# Patient Record
Sex: Male | Born: 1965 | Race: Black or African American | Hispanic: No | Marital: Married | Smoking: Never smoker
Health system: Southern US, Community
[De-identification: ages and names within clinical notes are randomized; demographics above are authoritative.]

## PROBLEM LIST (undated history)

## (undated) DIAGNOSIS — C801 Malignant (primary) neoplasm, unspecified: Secondary | ICD-10-CM

## (undated) DIAGNOSIS — M109 Gout, unspecified: Secondary | ICD-10-CM

## (undated) DIAGNOSIS — J189 Pneumonia, unspecified organism: Secondary | ICD-10-CM

## (undated) DIAGNOSIS — I2699 Other pulmonary embolism without acute cor pulmonale: Secondary | ICD-10-CM

## (undated) HISTORY — PX: ACHILLES TENDON SURGERY: SHX542

---

## 2003-11-22 ENCOUNTER — Ambulatory Visit (HOSPITAL_BASED_OUTPATIENT_CLINIC_OR_DEPARTMENT_OTHER): Admission: RE | Admit: 2003-11-22 | Discharge: 2003-11-22 | Payer: Self-pay | Admitting: Orthopaedic Surgery

## 2011-05-29 ENCOUNTER — Ambulatory Visit (INDEPENDENT_AMBULATORY_CARE_PROVIDER_SITE_OTHER): Payer: No Typology Code available for payment source | Admitting: Internal Medicine

## 2011-05-29 DIAGNOSIS — F988 Other specified behavioral and emotional disorders with onset usually occurring in childhood and adolescence: Secondary | ICD-10-CM

## 2011-05-29 DIAGNOSIS — I10 Essential (primary) hypertension: Secondary | ICD-10-CM

## 2011-11-27 ENCOUNTER — Encounter: Payer: Self-pay | Admitting: Internal Medicine

## 2011-11-27 ENCOUNTER — Ambulatory Visit (INDEPENDENT_AMBULATORY_CARE_PROVIDER_SITE_OTHER): Payer: No Typology Code available for payment source | Admitting: Internal Medicine

## 2011-11-27 VITALS — BP 140/94 | HR 64 | Temp 97.6°F | Resp 18 | Ht 69.5 in | Wt 238.0 lb

## 2011-11-27 DIAGNOSIS — F988 Other specified behavioral and emotional disorders with onset usually occurring in childhood and adolescence: Secondary | ICD-10-CM

## 2011-11-27 DIAGNOSIS — Z6834 Body mass index (BMI) 34.0-34.9, adult: Secondary | ICD-10-CM

## 2011-11-27 DIAGNOSIS — G4700 Insomnia, unspecified: Secondary | ICD-10-CM

## 2011-11-27 HISTORY — DX: Other specified behavioral and emotional disorders with onset usually occurring in childhood and adolescence: F98.8

## 2011-11-27 MED ORDER — AMPHETAMINE-DEXTROAMPHET ER 20 MG PO CP24: 20.0000 mg | ORAL_CAPSULE | ORAL | Status: DC

## 2011-11-27 MED ORDER — ZOLPIDEM TARTRATE 10 MG PO TABS: 10.0000 mg | ORAL_TABLET | Freq: Every evening | ORAL | Status: DC | PRN

## 2011-11-27 NOTE — Addendum Note (Signed)
Addended by: Tonye Pearson on: 11/27/2011 11:36 AM   Modules accepted: Level of Service

## 2011-11-27 NOTE — Progress Notes (Addendum)
  Subjective:    Patient ID: Ricky Bentley, male    DOB: 04-28-1966, 46 y.o.   MRN: [id number]  HPI Patient Active Problem List  Diagnosis  . BMI 34.0-34.9,adult  . Insomnia  . ADD (attention deficit disorder)    -  Elevated BP w/o dx of HTN  Doing well but sonata didn't work 3-4 nights a week wakes early or often  Adder working well Renita Papa busy-commerc/resid real estate  No weight loss-not active enough but now coaching boys baseball/staying home w/ them and cousins at times  Random home bp ok   Review of Systems-no changes     Objective:   Physical Exam Filed Vitals:   11/27/11 1104  BP: 140/94  Pulse: 64  Temp: 97.6 F (36.4 C)  Resp: 18  wt 238 PERRLA Ht reg rhythm Neuro intact        Assessment & Plan:   1. Insomnia   2. ADD (attention deficit disorder)   3. BMI 34.0-34.9,adult    Cont follow BP at home Wt loss-change snacks No chg ADD Rx ambien trial-occas use  Meds ordered this encounter  Medications  . DISCONTD: amphetamine-dextroamphetamine (ADDERALL XR) 20 MG 24 hr capsule    Sig: Take 20 mg by mouth every morning.  Marland Kitchen amphetamine-dextroamphetamine (ADDERALL XR) 20 MG 24 hr capsule    Sig: Take 1 capsule (20 mg total) by mouth every morning.    Dispense:  30 capsule    Refill:  0  . amphetamine-dextroamphetamine (ADDERALL XR, 20MG ,) 20 MG 24 hr capsule    Sig: Take 1 capsule (20 mg total) by mouth every morning.    Dispense:  30 capsule    Refill:  0  . amphetamine-dextroamphetamine (ADDERALL XR, 20MG ,) 20 MG 24 hr capsule    Sig: Take 1 capsule (20 mg total) by mouth every morning.    Dispense:  30 capsule    Refill:  0  . zolpidem (AMBIEN) 10 MG tablet    Sig: Take 1 tablet (10 mg total) by mouth at bedtime as needed for sleep.    Dispense:  15 tablet    Refill:  2   3 months -6 months  Can call if stable

## 2012-03-04 ENCOUNTER — Ambulatory Visit (INDEPENDENT_AMBULATORY_CARE_PROVIDER_SITE_OTHER): Payer: No Typology Code available for payment source | Admitting: Internal Medicine

## 2012-03-04 ENCOUNTER — Encounter: Payer: Self-pay | Admitting: Internal Medicine

## 2012-03-04 VITALS — BP 130/82 | HR 60 | Temp 97.6°F | Resp 16 | Ht 69.0 in | Wt 234.0 lb

## 2012-03-04 DIAGNOSIS — G4700 Insomnia, unspecified: Secondary | ICD-10-CM

## 2012-03-04 DIAGNOSIS — F988 Other specified behavioral and emotional disorders with onset usually occurring in childhood and adolescence: Secondary | ICD-10-CM

## 2012-03-04 DIAGNOSIS — Z6834 Body mass index (BMI) 34.0-34.9, adult: Secondary | ICD-10-CM

## 2012-03-04 MED ORDER — AMPHETAMINE-DEXTROAMPHET ER 20 MG PO CP24: 20.0000 mg | ORAL_CAPSULE | ORAL | Status: DC

## 2012-03-04 MED ORDER — ZOLPIDEM TARTRATE ER 12.5 MG PO TBCR: 12.5000 mg | EXTENDED_RELEASE_TABLET | Freq: Every evening | ORAL | Status: DC | PRN

## 2012-03-04 NOTE — Progress Notes (Signed)
Patient Active Problem List  Diagnosis  . BMI 34.0-34.9,adult  . Insomnia  . ADD (attention deficit disorder)  Doing very well Meds still helping greatly at work Still has trouble waking during the night even with Ambien 10 mg which he takes occasionally after 3 nights in a row of poor sleep He would like to try long-acting form  Has done better with exercise and lost 6 pounds Now running and will do a 5K this weekend Goal 210  Boys 9&8 playing ball  Continues in good health in general/denies flu vaccine  Exam Filed Vitals:   03/04/12 1039  BP: 130/82  Pulse: 60  Temp: 97.6 F (36.4 C)  Resp: 16   Pupils ERRLA Heart regular Neurological intact  1. ADD (attention deficit disorder)   2. BMI 34.0-34.9,adult   3. Insomnia    Meds ordered this encounter  Medications  . zolpidem (AMBIEN CR) 12.5 MG CR tablet    Sig: Take 1 tablet (12.5 mg total) by mouth at bedtime as needed for sleep.    Dispense:  15 tablet    Refill:  2  . amphetamine-dextroamphetamine (ADDERALL XR) 20 MG 24 hr capsule    Sig: Take 1 capsule (20 mg total) by mouth every morning.    Dispense:  30 capsule    Refill:  0  . amphetamine-dextroamphetamine (ADDERALL XR) 20 MG 24 hr capsule    Sig: Take 1 capsule (20 mg total) by mouth every morning.    Dispense:  30 capsule    Refill:  0  . amphetamine-dextroamphetamine (ADDERALL XR) 20 MG 24 hr capsule    Sig: Take 1 capsule (20 mg total) by mouth every morning.    Dispense:  30 capsule    Refill:  0   Followup 3-6 months/or call CPE with immunization review and lipid profile and prostate antigen at age 46

## 2012-09-02 ENCOUNTER — Ambulatory Visit: Payer: No Typology Code available for payment source | Admitting: Internal Medicine

## 2013-01-27 ENCOUNTER — Other Ambulatory Visit: Payer: Self-pay | Admitting: Internal Medicine

## 2013-01-27 ENCOUNTER — Ambulatory Visit (INDEPENDENT_AMBULATORY_CARE_PROVIDER_SITE_OTHER): Payer: BC Managed Care – PPO | Admitting: Internal Medicine

## 2013-01-27 ENCOUNTER — Encounter: Payer: Self-pay | Admitting: Internal Medicine

## 2013-01-27 VITALS — BP 130/82 | HR 70 | Temp 98.0°F | Resp 16 | Ht 69.5 in | Wt 246.0 lb

## 2013-01-27 DIAGNOSIS — E291 Testicular hypofunction: Secondary | ICD-10-CM

## 2013-01-27 DIAGNOSIS — N529 Male erectile dysfunction, unspecified: Secondary | ICD-10-CM

## 2013-01-27 DIAGNOSIS — G4700 Insomnia, unspecified: Secondary | ICD-10-CM

## 2013-01-27 DIAGNOSIS — E663 Overweight: Secondary | ICD-10-CM

## 2013-01-27 DIAGNOSIS — F988 Other specified behavioral and emotional disorders with onset usually occurring in childhood and adolescence: Secondary | ICD-10-CM

## 2013-01-27 MED ORDER — AMPHETAMINE-DEXTROAMPHET ER 20 MG PO CP24: 20.0000 mg | ORAL_CAPSULE | ORAL | Status: DC

## 2013-01-27 MED ORDER — ZOLPIDEM TARTRATE ER 12.5 MG PO TBCR: 12.5000 mg | EXTENDED_RELEASE_TABLET | Freq: Every evening | ORAL | Status: DC | PRN

## 2013-01-27 NOTE — Progress Notes (Signed)
  Subjective:    Patient ID: Ricky Bentley, male    DOB: April 20, 1966, 47 y.o.   MRN: [id number]  HPI Followup for attention deficit disorder/doing well/no side effects  Insomnia much better controlled with Ambien CR when necessary  Now noticing erectile dysfunction. Has had difficulty sustaining erections. No problems with interest. His wife is complaining. This has been present for 3-6 months. He has gained 10 pounds in that time.   Review of Systems No fever chills or night sweats No extra fatigue or weight loss No chest pain or palpitations No shortness of breath No peripheral edema No peripheral paresthesias    Objective:   Physical Exam BP 130/82  Pulse 70  Temp(Src) 98 F (36.7 C) (Oral)  Resp 16  Ht 5' 9.5" (1.765 m)  Wt 246 lb (111.585 kg)  BMI 35.82 kg/m2  SpO2 98% HEENT clear No thyromegaly Heart regular without murmur Lungs clear No peripheral edema Good peripheral pulses No peripheral paresthesias Mood good affect appropriate       Assessment & Plan:  ADD (attention deficit disorder) -  Plan: amphetamine-dextroamphetamine (ADDERALL XR) 20 MG 24 hr capsule,   ED (erectile dysfunction) - Plan: CBC with Differential, TSH, Testosterone, free, total, PSA  Overweight - Plan: Comprehensive metabolic panel, Lipid panel,  Insomnia- zolpidem (AMBIEN CR) 12.5 MG CR tablet

## 2013-01-28 LAB — COMPREHENSIVE METABOLIC PANEL
Albumin: 4.9 g/dL (ref 3.5–5.2)
BUN: 20 mg/dL (ref 6–23)
Calcium: 9.4 mg/dL (ref 8.4–10.5)
Chloride: 104 mEq/L (ref 96–112)
Creat: 1.45 mg/dL — ABNORMAL HIGH (ref 0.50–1.35)
Glucose, Bld: 83 mg/dL (ref 70–99)
Potassium: 4.4 mEq/L (ref 3.5–5.3)

## 2013-01-28 LAB — CBC WITH DIFFERENTIAL/PLATELET
Basophils Absolute: 0 10*3/uL (ref 0.0–0.1)
Basophils Relative: 1 % (ref 0–1)
Eosinophils Relative: 2 % (ref 0–5)
HCT: 45.1 % (ref 39.0–52.0)
Hemoglobin: 15.8 g/dL (ref 13.0–17.0)
Lymphocytes Relative: 33 % (ref 12–46)
MCHC: 35 g/dL (ref 30.0–36.0)
MCV: 86.2 fL (ref 78.0–100.0)
Monocytes Absolute: 0.4 10*3/uL (ref 0.1–1.0)
Monocytes Relative: 8 % (ref 3–12)
Neutro Abs: 2.8 10*3/uL (ref 1.7–7.7)
RDW: 13.8 % (ref 11.5–15.5)

## 2013-01-28 LAB — TESTOSTERONE, FREE, TOTAL, SHBG
Testosterone, Free: 45.1 pg/mL — ABNORMAL LOW (ref 47.0–244.0)
Testosterone-% Free: 2.6 % (ref 1.6–2.9)

## 2013-01-28 LAB — LIPID PANEL
Cholesterol: 173 mg/dL (ref 0–200)
HDL: 45 mg/dL (ref >39)
Total CHOL/HDL Ratio: 3.8 Ratio
Triglycerides: 150 mg/dL — ABNORMAL HIGH (ref <150)

## 2013-01-28 LAB — PSA: PSA: 1.03 ng/mL (ref <=4.00)

## 2013-01-30 ENCOUNTER — Encounter: Payer: Self-pay | Admitting: Internal Medicine

## 2013-02-03 NOTE — Addendum Note (Signed)
Addended by: Tonye Pearson on: 02/03/2013 06:40 PM   Modules accepted: Orders

## 2013-02-03 NOTE — Progress Notes (Signed)
Results for orders placed in visit on 01/27/13  CBC WITH DIFFERENTIAL      Result Value Range   WBC 5.0  4.0 - 10.5 K/uL   RBC 5.23  4.22 - 5.81 MIL/uL   Hemoglobin 15.8  13.0 - 17.0 g/dL   HCT 19.1  47.8 - 29.5 %   MCV 86.2  78.0 - 100.0 fL   MCH 30.2  26.0 - 34.0 pg   MCHC 35.0  30.0 - 36.0 g/dL   RDW 62.1  30.8 - 65.7 %   Platelets 263  150 - 400 K/uL   Neutrophils Relative % 56  43 - 77 %   Neutro Abs 2.8  1.7 - 7.7 K/uL   Lymphocytes Relative 33  12 - 46 %   Lymphs Abs 1.6  0.7 - 4.0 K/uL   Monocytes Relative 8  3 - 12 %   Monocytes Absolute 0.4  0.1 - 1.0 K/uL   Eosinophils Relative 2  0 - 5 %   Eosinophils Absolute 0.1  0.0 - 0.7 K/uL   Basophils Relative 1  0 - 1 %   Basophils Absolute 0.0  0.0 - 0.1 K/uL   Smear Review Criteria for review not met    COMPREHENSIVE METABOLIC PANEL      Result Value Range   Sodium 139  135 - 145 mEq/L   Potassium 4.4  3.5 - 5.3 mEq/L   Chloride 104  96 - 112 mEq/L   CO2 27  19 - 32 mEq/L   Glucose, Bld 83  70 - 99 mg/dL   BUN 20  6 - 23 mg/dL   Creat 8.46 (*) 9.62 - 1.35 mg/dL   Total Bilirubin 1.3 (*) 0.3 - 1.2 mg/dL   Alkaline Phosphatase 48  39 - 117 U/L   AST 25  0 - 37 U/L   ALT 27  0 - 53 U/L   Total Protein 7.0  6.0 - 8.3 g/dL   Albumin 4.9  3.5 - 5.2 g/dL   Calcium 9.4  8.4 - 95.2 mg/dL  LIPID PANEL      Result Value Range   Cholesterol 173  0 - 200 mg/dL   Triglycerides 841 (*) <150 mg/dL   HDL 45  >32 mg/dL   Total CHOL/HDL Ratio 3.8     VLDL 30  0 - 40 mg/dL   LDL Cholesterol 98  0 - 99 mg/dL  TSH      Result Value Range   TSH 1.779  0.350 - 4.500 uIU/mL  TESTOSTERONE, FREE, TOTAL      Result Value Range   Testosterone 176 (*) 300 - 890 ng/dL   Sex Hormone Binding 18  13 - 71 nmol/L   Testosterone, Free 45.1 (*) 47.0 - 244.0 pg/mL   Testosterone-% Freee. 2.6  1.6 - 2.9 %  PSA      Result Value Range   PSA 1.03  <=4.00 ng/mL   Fsh/LH wnl so secondary hypogonadism Refer to endo to complete w/u and plan rx

## 2013-02-16 ENCOUNTER — Encounter: Payer: Self-pay | Admitting: Endocrinology

## 2013-02-16 ENCOUNTER — Ambulatory Visit (INDEPENDENT_AMBULATORY_CARE_PROVIDER_SITE_OTHER): Payer: BC Managed Care – PPO | Admitting: Endocrinology

## 2013-02-16 VITALS — BP 130/90 | HR 59 | Temp 98.3°F | Resp 12 | Ht 70.0 in | Wt 249.9 lb

## 2013-02-16 DIAGNOSIS — E291 Testicular hypofunction: Secondary | ICD-10-CM

## 2013-02-16 DIAGNOSIS — N529 Male erectile dysfunction, unspecified: Secondary | ICD-10-CM

## 2013-02-16 DIAGNOSIS — R5381 Other malaise: Secondary | ICD-10-CM

## 2013-02-16 DIAGNOSIS — N289 Disorder of kidney and ureter, unspecified: Secondary | ICD-10-CM

## 2013-02-16 NOTE — Progress Notes (Signed)
Patient ID: Ricky Bentley, male   DOB: [birth year], 47 y.o.   MRN: [id number]  Complaint: Decreased libido  History of Present Illness  For about a year he has noticed decreased sexual desire and this has been progressively getting worse He does not think he has any unusual stress or other factors in his relationship to affect libido He had mentioned to his primary care physician that he had decreased erectile function but mainly has difficulty with inadequate firmness of erection. He still has normal nocturnal erections He may also have had less motivation and stamina for activities over the last year. He still tries to exercise with walking or running every day Overall he thinks he has gained weight over the last year from inconsistent diet and recently has been trying to modify this to help his weight  HypogonadismWas diagnosed in 8/13 when he was being evaluated for above symptoms He denies the following: Height loss, low trauma fracture, Hot flushes, sweating spells, breast enlargement, long term steroid use, history of testicular injury or mumps in childhood.   Wt Readings from Last 3 Encounters:  02/16/13 249 lb 14.4 oz (113.354 kg)  01/27/13 246 lb (111.585 kg)  03/04/12 234 lb (106.142 kg)    The lab findings have includedtestosterone level of 176 with free testosterone 45, prolactin level was not checked , LH level is 2.7     Lab Results  Component Value Date   TESTOSTERONE 176* 01/27/2013   Office Visit on 01/27/2013  Component Date Value Range Status  . WBC 01/27/2013 5.0  4.0 - 10.5 K/uL Final  . RBC 01/27/2013 5.23  4.22 - 5.81 MIL/uL Final  . Hemoglobin 01/27/2013 15.8  13.0 - 17.0 g/dL Final  . HCT 08/65/7846 45.1  39.0 - 52.0 % Final  . MCV 01/27/2013 86.2  78.0 - 100.0 fL Final  . MCH 01/27/2013 30.2  26.0 - 34.0 pg Final  . MCHC 01/27/2013 35.0  30.0 - 36.0 g/dL Final  . RDW 96/29/5284 13.8  11.5 - 15.5 % Final  . Platelets 01/27/2013 263  150 - 400  K/uL Final  . Neutrophils Relative % 01/27/2013 56  43 - 77 % Final  . Neutro Abs 01/27/2013 2.8  1.7 - 7.7 K/uL Final  . Lymphocytes Relative 01/27/2013 33  12 - 46 % Final  . Lymphs Abs 01/27/2013 1.6  0.7 - 4.0 K/uL Final  . Monocytes Relative 01/27/2013 8  3 - 12 % Final  . Monocytes Absolute 01/27/2013 0.4  0.1 - 1.0 K/uL Final  . Eosinophils Relative 01/27/2013 2  0 - 5 % Final  . Eosinophils Absolute 01/27/2013 0.1  0.0 - 0.7 K/uL Final  . Basophils Relative 01/27/2013 1  0 - 1 % Final  . Basophils Absolute 01/27/2013 0.0  0.0 - 0.1 K/uL Final  . Smear Review 01/27/2013 Criteria for review not met   Final  . Sodium 01/27/2013 139  135 - 145 mEq/L Final  . Potassium 01/27/2013 4.4  3.5 - 5.3 mEq/L Final  . Chloride 01/27/2013 104  96 - 112 mEq/L Final  . CO2 01/27/2013 27  19 - 32 mEq/L Final  . Glucose, Bld 01/27/2013 83  70 - 99 mg/dL Final  . BUN 13/24/4010 20  6 - 23 mg/dL Final  . Creat 27/25/3664 1.45* 0.50 - 1.35 mg/dL Final  . Total Bilirubin 01/27/2013 1.3* 0.3 - 1.2 mg/dL Final  . Alkaline Phosphatase 01/27/2013 48  39 - 117 U/L Final  . AST 01/27/2013  25  0 - 37 U/L Final  . ALT 01/27/2013 27  0 - 53 U/L Final  . Total Protein 01/27/2013 7.0  6.0 - 8.3 g/dL Final  . Albumin 16/03/9603 4.9  3.5 - 5.2 g/dL Final  . Calcium 54/02/8118 9.4  8.4 - 10.5 mg/dL Final  . Cholesterol 14/78/2956 173  0 - 200 mg/dL Final   Comment: ATP III Classification:                                < 200        mg/dL        Desirable                               200 - 239     mg/dL        Borderline High                               >= 240        mg/dL        High                             . Triglycerides 01/27/2013 150* <150 mg/dL Final  . HDL 21/30/8657 45  >39 mg/dL Final  . Total CHOL/HDL Ratio 01/27/2013 3.8   Final  . VLDL 01/27/2013 30  0 - 40 mg/dL Final  . LDL Cholesterol 01/27/2013 98  0 - 99 mg/dL Final   Comment:                            Total Cholesterol/HDL Ratio:CHD  Risk                                                 Coronary Heart Disease Risk Table                                                                 Men       Women                                   1/2 Average Risk              3.4        3.3                                       Average Risk              5.0        4.4  2X Average Risk              9.6        7.1                                    3X Average Risk             23.4       11.0                          Use the calculated Patient Ratio above and the CHD Risk table                           to determine the patient's CHD Risk.                          ATP III Classification (LDL):                                < 100        mg/dL         Optimal                               100 - 129     mg/dL         Near or Above Optimal                               130 - 159     mg/dL         Borderline High                               160 - 189     mg/dL         High                                > 190        mg/dL         Very High                             . TSH 01/27/2013 1.779  0.350 - 4.500 uIU/mL Final  . Testosterone 01/27/2013 176* 300 - 890 ng/dL Final   Comment:           Tanner Stage       Male              Male                                        I              < 30 ng/dL        < 10 ng/dL  II             < 150 ng/dL       < 30 ng/dL                                        III            100-320 ng/dL     < 35 ng/dL                                        IV             200-970 ng/dL     16-10 ng/dL                                        V/Adult        300-890 ng/dL     96-04 ng/dL                             . Sex Hormone Binding 01/27/2013 18  13 - 71 nmol/L Final  . Testosterone, Free 01/27/2013 45.1* 47.0 - 244.0 pg/mL Final   Comment:                            The concentration of free testosterone is derived from a mathematical                           expression based on constants for the binding of testosterone to sex                          hormone-binding globulin and albumin.  . Testosterone-% Freee. 01/27/2013 2.6  1.6 - 2.9 % Final  . PSA 01/27/2013 1.03  <=4.00 ng/mL Final   Comment: Test Methodology: ECLIA PSA (Electrochemiluminescence Immunoassay)                                                     For PSA values from 2.5-4.0, particularly in younger men <60 years                          old, the AUA and NCCN suggest testing for % Free PSA (3515) and                          evaluation of the rate of increase in PSA (PSA velocity).  Orders Only on 01/27/2013  Component Date Value Range Status  . FSH 01/27/2013 2.2  1.4 - 18.1 mIU/mL Final   Comment: Reference Ranges:                                   Male:  1.4 -  18.1 mIU/mL                                   Male:   Follicular Phase    2.5 -  10.2 mIU/mL                                             MidCycle Peak       3.4 -  33.4 mIU/mL                                             Luteal Phase        1.5 -   9.1 mIU/mL                                             Post Menopausal    23.0 - 116.3 mIU/mL                                             Pregnant                <   0.3 mIU/mL  . LH 01/27/2013 2.7  1.5 - 9.3 mIU/mL Final   Comment: Reference Ranges:                                   Male:     20 - 70 Years           1.5 -  9.3 mIU/mL                                                > 70 Years           3.1 - 34.6 mIU/mL                                   Male:   Follicular Phase        1.9 - 12.5 mIU/mL                                             Midcycle                8.7 - 76.3 mIU/mL                                             Luteal Phase            0.5 - 16.9 mIU/mL  Post Menopausal        15.9 - 54.0 mIU/mL                                             Pregnant                    <   1.5 mIU/mL                                             Contraceptives          0.7 -  5.6 mIU/mL                                   Children:                             <  6.0 mIU/mL                                  Medication List       This list is accurate as of: 02/16/13 10:48 AM.  Always use your most recent med list.               amphetamine-dextroamphetamine 20 MG 24 hr capsule  Commonly known as:  ADDERALL XR  Take 1 capsule (20 mg total) by mouth every morning.     zolpidem 12.5 MG CR tablet  Commonly known as:  AMBIEN CR  Take 1 tablet (12.5 mg total) by mouth at bedtime as needed for sleep.        Allergies: No Known Allergies  No past medical history on file.  No past surgical history on file.  No family history on file.  Social History:  reports that he has never smoked. He does not have any smokeless tobacco history on file. His alcohol and drug histories are not on file.  REVIEW of systems:  No history of unusual headaches or visual changes No history of diabetes No history of hypertension Has history of ADD He takes zolpidem for insomnia as needed   General Examination:   BP 130/90  Pulse 59  Temp(Src) 98.3 F (36.8 C)  Resp 12  Ht 5\' 10"  (1.778 m)  Wt 249 lb 14.4 oz (113.354 kg)  BMI 35.86 kg/m2  SpO2 96%   GENERAL APPEARANCE mild generalized obesity.  SKIN:normal, no rash or pigmentation.  HEENT:Oral mucosa normal.   EYES:normal external appearance of eyes, Fundi show normal discs and vessels.   NECK:no lymphadenopathy, no thyromegaly. Chest: No gynecomastia  LUNGS:clear to auscultation bilaterally, no wheezes, rhonchi, rales.  HEART:normal S1 And S2, no S3, S4, murmur or click.  ABDOMEN:no hepatosplenomegaly, no masses palpated, soft and not tender.  MALE GENITOURINARY:Testicles normal, about 3.5-4 cm.  MUSCULOSKELETALNo enlargement or deformity of joints.   EXTREMITIES:no clubbing, no edema.  NEUROLOGIC EXAM: Biceps reflexes normal (2+) bilaterally.   Assessment/ Plan:  ? 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   He may have low testosterone level based on metabolic syndrome/insulin resistance because of his obesity, recent weight gain and family history of diabetes  Currently his main symptom appears to be decreased libido with only mild erectile dysfunction He thinks he has had some decreased stamina and energy level also Not clear why he has erectile dysfunction at his age with no other comorbid conditions  However his labs were drawn in the afternoon and his free testosterone level is only minimally reduced Would like to check his labs in the morning and repeat a free testosterone level to confirm hypogonadism Also will check prolactin to rule out pituitary microadenoma as potential cause Will check free T4 level to rule out secondary hypothyroidism Also will check fasting glucose to rule out prediabetes  Discussed potential treatment options including observation only with weight loss efforts, testosterone supplementation and also the benefits and possible long-term effects of testosterone supplements  He is willing to try weight loss efforts first especially if testosterone level is only borderline  RENAL insufficiency: His creatinine is 1.45 which is unusual and the cause is not evident. Will repeat creatinine level Blood pressure is high normal today, possibly related to anxiety

## 2013-02-16 NOTE — Patient Instructions (Signed)
Fasting labs

## 2013-02-19 DIAGNOSIS — R7989 Other specified abnormal findings of blood chemistry: Secondary | ICD-10-CM | POA: Insufficient documentation

## 2013-02-19 DIAGNOSIS — N529 Male erectile dysfunction, unspecified: Secondary | ICD-10-CM | POA: Insufficient documentation

## 2013-03-01 ENCOUNTER — Other Ambulatory Visit (INDEPENDENT_AMBULATORY_CARE_PROVIDER_SITE_OTHER): Payer: BC Managed Care – PPO

## 2013-03-01 DIAGNOSIS — E291 Testicular hypofunction: Secondary | ICD-10-CM

## 2013-03-01 DIAGNOSIS — R5381 Other malaise: Secondary | ICD-10-CM

## 2013-03-01 DIAGNOSIS — N289 Disorder of kidney and ureter, unspecified: Secondary | ICD-10-CM

## 2013-03-01 LAB — BASIC METABOLIC PANEL
CO2: 28 mEq/L (ref 19–32)
Calcium: 8.6 mg/dL (ref 8.4–10.5)
Creatinine, Ser: 1.5 mg/dL (ref 0.4–1.5)
GFR: 67.19 mL/min (ref >60.00)
Glucose, Bld: 93 mg/dL (ref 70–99)
Sodium: 137 mEq/L (ref 135–145)

## 2013-03-11 ENCOUNTER — Telehealth: Payer: Self-pay | Admitting: *Deleted

## 2013-03-11 MED ORDER — TESTOSTERONE 4 MG/24HR TD PT24: 1.0000 | MEDICATED_PATCH | Freq: Every day | TRANSDERMAL | Status: DC

## 2013-03-11 NOTE — Telephone Encounter (Signed)
Left message to return call 

## 2013-03-11 NOTE — Telephone Encounter (Signed)
Message copied by Hermenia Bers on Thu Mar 11, 2013 11:46 AM ------      Message from: Reather Littler      Created: Thu Mar 11, 2013  9:53 AM       Testosterone level was low, would like him to start Androderm 4 mg patch daily and followup in 6 weeks with labs.      He still has a mild abnormality of kidney test and needs to discuss this with his primary care doctor ------

## 2013-03-17 ENCOUNTER — Telehealth: Payer: Self-pay | Admitting: Endocrinology

## 2013-03-17 NOTE — Telephone Encounter (Signed)
Document was faxed over to patients insurance company, waiting on response

## 2013-04-26 ENCOUNTER — Ambulatory Visit (INDEPENDENT_AMBULATORY_CARE_PROVIDER_SITE_OTHER): Payer: BC Managed Care – PPO | Admitting: Endocrinology

## 2013-04-26 ENCOUNTER — Encounter: Payer: Self-pay | Admitting: Endocrinology

## 2013-04-26 ENCOUNTER — Other Ambulatory Visit: Payer: Self-pay | Admitting: *Deleted

## 2013-04-26 VITALS — BP 138/82 | HR 53 | Temp 98.6°F | Resp 12 | Ht 70.0 in | Wt 242.9 lb

## 2013-04-26 DIAGNOSIS — E291 Testicular hypofunction: Secondary | ICD-10-CM

## 2013-04-26 DIAGNOSIS — R944 Abnormal results of kidney function studies: Secondary | ICD-10-CM

## 2013-04-26 HISTORY — DX: Testicular hypofunction: E29.1

## 2013-04-26 LAB — URINALYSIS, ROUTINE W REFLEX MICROSCOPIC
Bilirubin Urine: NEGATIVE
Hgb urine dipstick: NEGATIVE
Leukocytes, UA: NEGATIVE
Total Protein, Urine: NEGATIVE
Urine Glucose: NEGATIVE
Urobilinogen, UA: 0.2 (ref 0.0–1.0)

## 2013-04-26 LAB — BASIC METABOLIC PANEL
BUN: 19 mg/dL (ref 6–23)
CO2: 28 mEq/L (ref 19–32)
Chloride: 102 mEq/L (ref 96–112)
Creatinine, Ser: 1.4 mg/dL (ref 0.4–1.5)
GFR: 69.33 mL/min (ref >60.00)
Glucose, Bld: 107 mg/dL — ABNORMAL HIGH (ref 70–99)
Potassium: 4.2 mEq/L (ref 3.5–5.1)
Sodium: 137 mEq/L (ref 135–145)

## 2013-04-26 MED ORDER — TESTOSTERONE 20.25 MG/1.25GM (1.62%) TD GEL: 40.5000 mg | Freq: Every day | TRANSDERMAL | Status: DC

## 2013-04-26 NOTE — Progress Notes (Signed)
Patient ID: Ricky Bentley, male   DOB: 29-Jul-1965, 47 y.o.   MRN: [id number]  Complaint: Decreased libido, followup  History of Present Illness  Background history: For about a year he has noticed decreased sexual desire and this has been progressively getting worse He also has had decreased erectile function but mainly has difficulty with inadequate firmness of erection. He still has normal nocturnal erections He  has had less motivation and stamina for activities over the last year. 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 because of low  LH level of 2.7  TREATMENT: He was started on Androderm 4 mg daily because of his persistently low testosterone levels and above symptoms He has had less fatigue and better motivation since his last visit and overall feels well He has actually lost weight, no change in exercise level He thinks he has had some better libido although not sure since his wife has been unwell He thinks he is having some problems with the Androderm because of decreased for dizziness and crimping in certain positions  Wt Readings from Last 3 Encounters:  04/26/13 242 lb 14.4 oz (110.179 kg)  02/16/13 249 lb 14.4 oz (113.354 kg)  01/27/13 246 lb (111.585 kg)     Lab Results  Component Value Date   TESTOSTERONE 176* 01/27/2013   No visits with results within 1 Month(s) from this visit. Latest known visit with results is:  Appointment on 03/01/2013  Component Date Value Range Status  . Sodium 03/01/2013 137  135 - 145 mEq/L Final  . Potassium 03/01/2013 4.2  3.5 -  5.1 mEq/L Final  . Chloride 03/01/2013 106  96 - 112 mEq/L Final  . CO2 03/01/2013 28  19 - 32 mEq/L Final  . Glucose, Bld 03/01/2013 93  70 - 99 mg/dL Final  . BUN 08/65/7846 21  6 - 23 mg/dL Final  . Creatinine, Ser 03/01/2013 1.5  0.4 - 1.5 mg/dL Final  . Calcium 96/29/5284 8.6  8.4 - 10.5 mg/dL Final  . GFR 13/24/4010 67.19  >60.00 mL/min Final  . Testosterone, total 03/01/2013 310.8* 348.0 - 1197.0 ng/dL Final  . Testosterone, Free 03/01/2013 5.6* 6.8 - 21.5 pg/mL Final  . Free T4 03/01/2013 1.00  0.60 - 1.60 ng/dL Final       Medication List       This list is accurate as of: 04/26/13  8:34 AM.  Always use your most recent med list.               amphetamine-dextroamphetamine 20 MG 24 hr capsule  Commonly known as:  ADDERALL XR  Take 1 capsule (20 mg total) by mouth every morning.     testosterone 4 MG/24HR Pt24 patch  Commonly known as:  ANDRODERM  Place 1 patch onto the skin daily.     zolpidem 12.5 MG CR tablet  Commonly known as:  AMBIEN CR  Take 1 tablet (12.5 mg total) by mouth at bedtime as needed for sleep.        Allergies: No Known Allergies  No past medical history on file.  No past  surgical history on file.  No family history on file.  Social History:  reports that he has never smoked. He does not have any smokeless tobacco history on file. His alcohol and drug histories are not on file.  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   He does appear to have hypogonadotropic hypogonadism probably related to metabolic syndrome although he has no other features of hypertension, glucose intolerance or known hyperlipidemia He feels subjectively better with testosterone supplementation with improved energy level, more  motivation and libido He does need a prolactin level which was not drawn on the last visit He does have some weight loss although he is not sure if he is actively doing this with However he is not pleased with the Androderm patch since it does not stick well and also  crimps with movement Will check his testosterone level today to verify how his level is with current 4 mg dose and adjust accordingly Given him information on AndroGel which may be preferable and he will use this if approved by insurance  RENAL insufficiency: His creatinine is 1.45 which is unusual and the cause is not evident. Not using large amounts of protein supplements Will repeat creatinine level and also urinalysis Blood pressure is not as high today  Brooks Stotz

## 2013-04-26 NOTE — Patient Instructions (Signed)
Stay on Androgel

## 2013-04-27 ENCOUNTER — Other Ambulatory Visit: Payer: Self-pay | Admitting: Internal Medicine

## 2013-04-28 ENCOUNTER — Other Ambulatory Visit: Payer: Self-pay | Admitting: Radiology

## 2013-05-24 ENCOUNTER — Other Ambulatory Visit: Payer: Self-pay | Admitting: *Deleted

## 2013-05-24 DIAGNOSIS — E291 Testicular hypofunction: Secondary | ICD-10-CM

## 2013-05-24 MED ORDER — TESTOSTERONE 20.25 MG/1.25GM (1.62%) TD GEL: 40.5000 mg | Freq: Every day | TRANSDERMAL | Status: DC

## 2013-05-26 ENCOUNTER — Other Ambulatory Visit: Payer: Self-pay | Admitting: *Deleted

## 2013-07-28 ENCOUNTER — Ambulatory Visit (INDEPENDENT_AMBULATORY_CARE_PROVIDER_SITE_OTHER): Payer: BC Managed Care – PPO | Admitting: Internal Medicine

## 2013-07-28 ENCOUNTER — Encounter: Payer: Self-pay | Admitting: Internal Medicine

## 2013-07-28 VITALS — BP 140/94 | HR 81 | Temp 98.9°F | Resp 16 | Ht 69.5 in | Wt 244.5 lb

## 2013-07-28 DIAGNOSIS — G4700 Insomnia, unspecified: Secondary | ICD-10-CM

## 2013-07-28 DIAGNOSIS — F988 Other specified behavioral and emotional disorders with onset usually occurring in childhood and adolescence: Secondary | ICD-10-CM

## 2013-07-28 DIAGNOSIS — Z6834 Body mass index (BMI) 34.0-34.9, adult: Secondary | ICD-10-CM

## 2013-07-28 DIAGNOSIS — Z23 Encounter for immunization: Secondary | ICD-10-CM

## 2013-07-28 MED ORDER — AMPHETAMINE-DEXTROAMPHET ER 20 MG PO CP24: 20.0000 mg | ORAL_CAPSULE | ORAL | Status: DC

## 2013-07-28 MED ORDER — ZOLPIDEM TARTRATE ER 12.5 MG PO TBCR: EXTENDED_RELEASE_TABLET | ORAL | Status: DC

## 2013-07-28 NOTE — Progress Notes (Addendum)
Subjective:    Patient ID: Ricky Bentley, male    DOB: 08/12/1965, 48 y.o.   MRN: [id number] This chart was scribed for Ellamae Siaobert Ovie Cornelio, MD by Valera CastleSteven Perry, ED Scribe. This patient was seen in room 24 and the patient's care was started at 11:37 AM.  Chief Complaint  Patient presents with  . Follow-up    medications- ADDERALL   HPI Ricky Bentley is a 48 y.o. male Pt presents for Adderall refill for his h/o ADD. Pt had labs done at Dr. Remus BlakeKumar's office with normal results. Pt's testosterone levels were normal, given medication.   He reports being healthy, denies any symptoms. He states the medication has given him more energy. He reports initially starting out with the patch, and was unsuccessful due to it falling off constantly. He reports being on gel now. He reports Ambien has been working well for him. He states he has been taking Adderall as needed, more frequently recently due to being busy. He reports his oldest son may need ADD, but for right now he is doing well.   PCP - Tonye PearsonOLITTLE, Alayia Meggison P, MD  Patient Active Problem List   Diagnosis Date Noted  . Hypogonadism male 04/26/2013  . Low testosterone 02/19/2013  . Erectile dysfunction 02/19/2013  . BMI 34.0-34.9,adult 11/27/2011  . Insomnia 11/27/2011  . ADD (attention deficit disorder) 11/27/2011   Prior to Admission medications   Medication Sig Start Date End Date Taking? Authorizing Provider  amphetamine-dextroamphetamine (ADDERALL XR) 20 MG 24 hr capsule Take 1 capsule (20 mg total) by mouth every morning. 01/27/13  Yes Tonye Pearsonobert P Deliana Avalos, MD  Testosterone (ANDROGEL) 20.25 MG/1.25GM (1.62%) GEL Place 40.5 mg onto the skin daily. 1 pump on each arm after shower every morning, allow to dry before dressing 05/24/13  Yes Reather LittlerAjay Kumar, MD  zolpidem (AMBIEN CR) 12.5 MG CR tablet TAKE 1 TABLET BY MOUTH EVERY NIGHT AT BEDTIME AS NEEDED FOR SLEEP 04/27/13  Yes Tonye Pearsonobert P Antawan Mchugh, MD   Review of Systems  Constitutional: Negative for  fatigue.  All other systems reviewed and are negative.      Objective:   Physical Exam Nursing note and vitals reviewed. Constitutional: Pt is oriented to person, place, and time. Pt appears well-developed and well-nourished. No distress.  HENT:  Head: Normocephalic and atraumatic.  Eyes: EOM are normal. Pupils are equal, round, and reactive to light.  Neck: Neck supple. Cardiovascular: Normal rate. Pulmonary/Chest: Effort normal. No respiratory distress.  Abdominal:  Musculoskeletal: Normal range of motion.    Neurological: Pt is alert and oriented to person, place, and time.  Skin: Skin is warm and dry.  Psychiatric: Pt has a normal mood and affect. Pt's behavior is normal.   BP 140/94  Pulse 81  Temp(Src) 98.9 F (37.2 C) (Oral)  Resp 16  Ht 5' 9.5" (1.765 m)  Wt 244 lb 8 oz (110.904 kg)  BMI 35.60 kg/m2  SpO2 98%    Assessment & Plan:  Need for prophylactic vaccination and inoculation against influenza - Plan: Flu Vaccine QUAD 36+ mos IM  ADD (attention deficit disorder) -  BMI 34.0-34.9,adult---needs to begin exercise w/ wt loss  Insomnia  Meds ordered this encounter  Medications  . zolpidem (AMBIEN CR) 12.5 MG CR tablet    Sig: TAKE 1 TABLET BY MOUTH  AT BEDTIME AS NEEDED FOR SLEEP    Dispense:  15 tablet    Refill:  5  . amphetamine-dextroamphetamine (ADDERALL XR) 20 MG 24 hr capsule  Sig: Take 1 capsule (20 mg total) by mouth every morning. For 60d from signing    Dispense:  30 capsule    Refill:  0  . amphetamine-dextroamphetamine (ADDERALL XR) 20 MG 24 hr capsule    Sig: Take 1 capsule (20 mg total) by mouth every morning. For 30d from signing    Dispense:  30 capsule    Refill:  0    I have completed the patient encounter in its entirety as documented by the scribe, with editing by me where necessary. Carleigh Buccieri P. Merla Riches, M.D.

## 2014-01-26 ENCOUNTER — Encounter: Payer: Self-pay | Admitting: Internal Medicine

## 2014-01-26 ENCOUNTER — Ambulatory Visit (INDEPENDENT_AMBULATORY_CARE_PROVIDER_SITE_OTHER): Payer: BC Managed Care – PPO | Admitting: Internal Medicine

## 2014-01-26 VITALS — BP 134/88 | HR 70 | Temp 97.5°F | Resp 16 | Ht 69.5 in | Wt 245.4 lb

## 2014-01-26 DIAGNOSIS — F988 Other specified behavioral and emotional disorders with onset usually occurring in childhood and adolescence: Secondary | ICD-10-CM

## 2014-01-26 DIAGNOSIS — G4700 Insomnia, unspecified: Secondary | ICD-10-CM

## 2014-01-26 MED ORDER — AMPHETAMINE-DEXTROAMPHET ER 20 MG PO CP24: 20.0000 mg | ORAL_CAPSULE | ORAL | Status: DC

## 2014-01-26 MED ORDER — ZOLPIDEM TARTRATE ER 12.5 MG PO TBCR: EXTENDED_RELEASE_TABLET | ORAL | Status: DC

## 2014-01-26 NOTE — Progress Notes (Signed)
Subjective:    Patient ID: Ricky Bentley, male    DOB: 02/14/1966, 48 y.o.   MRN: [id number]  HPI Chief Complaint  Patient presents with  . Follow-up    6 months follow up for Adderall XR and Ambien CR   This chart was scribed for Tonye Pearsonobert P Dawnetta Copenhaver, MD by Andrew Auaven Small, ED Scribe. This patient was seen in room 26 and the patient's care was started at 11:12 AM.  HPI Comments: Ricky Bentley is a 48 y.o. male with h/o ADD and insomnia who presents to the [facility] for a 6 month f/u regarding adderall XR and Ambein. Pt is tolerating medication well. He reports taking ambein as needed. He denies side effects. He denies needing to make changes to medication. Pt denies h/o prostate cancer in family.    Pt has has not lost weight since last visit. He reports [place].   History reviewed. No pertinent past medical history. Past Surgical History  Procedure Laterality Date  . Achilles tendon surgery     Prior to Admission medications   Medication Sig Start Date End Date Taking? Authorizing Provider  amphetamine-dextroamphetamine (ADDERALL XR) 20 MG 24 hr capsule Take 1 capsule (20 mg total) by mouth every morning. For 60d from signing 07/28/13  Yes Tonye Pearsonobert P Khaliel Morey, MD  amphetamine-dextroamphetamine (ADDERALL XR) 20 MG 24 hr capsule Take 1 capsule (20 mg total) by mouth every morning. For 30d from signing 07/28/13  Yes Tonye Pearsonobert P Lateasha Breuer, MD  Testosterone (ANDROGEL) 20.25 MG/1.25GM (1.62%) GEL Place 40.5 mg onto the skin daily. 1 pump on each arm after shower every morning, allow to dry before dressing 05/24/13  Yes Reather LittlerAjay Kumar, MD  zolpidem (AMBIEN CR) 12.5 MG CR tablet TAKE 1 TABLET BY MOUTH  AT BEDTIME AS NEEDED FOR SLEEP 07/28/13  Yes Tonye Pearsonobert P Eshan Trupiano, MD   Review of Systems  Objective:   Physical Exam  Nursing note and vitals reviewed. Constitutional: He is oriented to person, place, and time. He appears  well-developed and well-nourished. No distress.  HENT:  Head: Normocephalic and atraumatic.  Eyes: Conjunctivae and EOM are normal.  Neck: Neck supple.  Cardiovascular: Normal rate.   Pulmonary/Chest: Effort normal.  Musculoskeletal: Normal range of motion.  Neurological: He is alert and oriented to person, place, and time.  Skin: Skin is warm and dry.  Psychiatric: He has a normal mood and affect. His behavior is normal.   Filed Vitals:   01/26/14 1058  BP: 134/88  Pulse: 70  Temp: 97.5 F (36.4 C)  Resp: 16   Filed Weights   01/26/14 1058  Weight: 245 lb 6.4 oz (111.313 kg)    Assessment & Plan:   1. ADD (attention deficit disorder)   2. insomnia Meds ordered this encounter  Medications  . amphetamine-dextroamphetamine (ADDERALL XR) 20 MG 24 hr capsule    Sig: Take 1 capsule (20 mg total) by mouth every morning. For 30d from signing    Dispense:  30 capsule    Refill:  0  . amphetamine-dextroamphetamine (ADDERALL XR) 20 MG 24 hr capsule    Sig: Take 1 capsule (20 mg total) by mouth every morning. For 60d from signing    Dispense:  30 capsule    Refill:  0  . zolpidem (AMBIEN CR) 12.5 MG CR tablet    Sig: TAKE 1 TABLET BY MOUTH  AT BEDTIME AS NEEDED FOR SLEEP    Dispense:  15 tablet    Refill:  5  . amphetamine-dextroamphetamine (ADDERALL XR) 20 MG 24 hr capsule    Sig: Take 1 capsule (20 mg total) by mouth every morning.    Dispense:  30 capsule    Refill:  0   I have completed the patient encounter in its entirety as documented by the scribe, with editing by me where necessary. Samin Milke P. Merla Riches, M.D. 6mos CPE next w/ HM

## 2014-07-27 ENCOUNTER — Encounter: Payer: Self-pay | Admitting: Internal Medicine

## 2014-07-27 ENCOUNTER — Ambulatory Visit (INDEPENDENT_AMBULATORY_CARE_PROVIDER_SITE_OTHER): Payer: BLUE CROSS/BLUE SHIELD | Admitting: Internal Medicine

## 2014-07-27 VITALS — BP 142/86 | HR 66 | Temp 98.1°F | Resp 16 | Ht 69.75 in | Wt 252.0 lb

## 2014-07-27 DIAGNOSIS — F909 Attention-deficit hyperactivity disorder, unspecified type: Secondary | ICD-10-CM

## 2014-07-27 DIAGNOSIS — Z0000 Encounter for general adult medical examination without abnormal findings: Secondary | ICD-10-CM

## 2014-07-27 DIAGNOSIS — F988 Other specified behavioral and emotional disorders with onset usually occurring in childhood and adolescence: Secondary | ICD-10-CM

## 2014-07-27 DIAGNOSIS — G4700 Insomnia, unspecified: Secondary | ICD-10-CM

## 2014-07-27 DIAGNOSIS — Z6834 Body mass index (BMI) 34.0-34.9, adult: Secondary | ICD-10-CM

## 2014-07-27 DIAGNOSIS — Z23 Encounter for immunization: Secondary | ICD-10-CM

## 2014-07-27 DIAGNOSIS — E291 Testicular hypofunction: Secondary | ICD-10-CM

## 2014-07-27 LAB — CBC WITH DIFFERENTIAL/PLATELET
Basophils Absolute: 0.1 10*3/uL (ref 0.0–0.1)
Basophils Relative: 2 % — ABNORMAL HIGH (ref 0–1)
EOS PCT: 3 % (ref 0–5)
Eosinophils Absolute: 0.2 10*3/uL (ref 0.0–0.7)
HCT: 46.3 % (ref 39.0–52.0)
HEMOGLOBIN: 16 g/dL (ref 13.0–17.0)
LYMPHS ABS: 1.8 10*3/uL (ref 0.7–4.0)
LYMPHS PCT: 36 % (ref 12–46)
MCH: 29.9 pg (ref 26.0–34.0)
MCHC: 34.6 g/dL (ref 30.0–36.0)
MCV: 86.4 fL (ref 78.0–100.0)
MPV: 9.1 fL (ref 8.6–12.4)
Monocytes Absolute: 0.4 10*3/uL (ref 0.1–1.0)
Monocytes Relative: 8 % (ref 3–12)
NEUTROS ABS: 2.6 10*3/uL (ref 1.7–7.7)
Neutrophils Relative %: 51 % (ref 43–77)
Platelets: 293 10*3/uL (ref 150–400)
RBC: 5.36 MIL/uL (ref 4.22–5.81)
RDW: 13.7 % (ref 11.5–15.5)
WBC: 5.1 10*3/uL (ref 4.0–10.5)

## 2014-07-27 LAB — IFOBT (OCCULT BLOOD): IFOBT: NEGATIVE

## 2014-07-27 MED ORDER — AMPHETAMINE-DEXTROAMPHET ER 20 MG PO CP24: 20.0000 mg | ORAL_CAPSULE | ORAL | Status: DC

## 2014-07-27 MED ORDER — ZOLPIDEM TARTRATE ER 12.5 MG PO TBCR: EXTENDED_RELEASE_TABLET | ORAL | Status: DC

## 2014-07-27 NOTE — Progress Notes (Signed)
Subjective:    Patient ID: Ricky Bentley, male    DOB: 02/05/1966, 49 y.o.   MRN: [id number]  HPIannual Patient Active Problem List   Diagnosis Date Noted  . ADD (attention deficit disorder) 11/27/2011    Priority: Medium Doing extremely well with work challenges on current dosage  He was not treated in high school or college when playing football and suffered tremendously in terms of academic output  He is now successful in Multimedia programmercommercial real estate  He has 2 sons who are in middle school who have not yet been identified with attention deficit disorder   . Hypogonadism male--- followed by Dr. Lucianne MussKumar  04/26/2013  . Low testosterone 02/19/2013  . Erectile dysfunction 02/19/2013  . BMI 34.0-34.9,adult 11/27/2011  . Insomnia--- response to intermittent Ambien  11/27/2011   No new complaints or problems Health maintenance up-to-date  He is now recovered from his second Achilles tendon rupture requiring surgery in the fall 2015-now both ankles have been worked on. He has returned to work and is able to walk a mile and a half but is still in the rehabilitation phase. He has gained about 7-10 pounds as a result  No family history of prostate cancer Even though the electronic records suggests that he is overdue for tetanus and flu vaccine, he had the flu vaccine today and had it last February, and he had the tetanus at the time of his recent surgery.  Review of Systems 14 point review of system negative as noted per form     Objective:   Physical Exam  Constitutional: He is oriented to person, place, and time. He appears well-developed and well-nourished.  HENT:  Head: Normocephalic and atraumatic.  Right Ear: Hearing, tympanic membrane, external ear and ear canal normal.  Left Ear: Hearing, tympanic membrane, external ear and ear canal normal.  Nose: Nose normal.  Mouth/Throat: Uvula is midline, oropharynx is clear and moist and mucous membranes are normal.  Eyes: Conjunctivae, EOM  and lids are normal. Pupils are equal, round, and reactive to light. Right eye exhibits no discharge. Left eye exhibits no discharge. No scleral icterus.  Neck: Trachea normal and normal range of motion. Neck supple. Carotid bruit is not present.  Cardiovascular: Normal rate, regular rhythm, normal heart sounds, intact distal pulses and normal pulses.   No murmur heard. Pulmonary/Chest: Effort normal and breath sounds normal. No respiratory distress. He has no wheezes. He has no rhonchi. He has no rales.  Abdominal: Soft. Normal appearance and bowel sounds are normal. He exhibits no abdominal bruit. There is no tenderness.  Genitourinary:  Initial prostate exam is not revealing for any masses or nodules and Hemosure was sent to the lab  Musculoskeletal: Normal range of motion. He exhibits no edema or tenderness.  He has scars on both Achilles. The right one is still somewhat tender and slightly swollen from recent surgery. He has good dorsiflexion of the foot.  Lymphadenopathy:       Head (right side): No submental, no submandibular, no tonsillar, no preauricular, no posterior auricular and no occipital adenopathy present.       Head (left side): No submental, no submandibular, no tonsillar, no preauricular, no posterior auricular and no occipital adenopathy present.    He has no cervical adenopathy.  Neurological: He is alert and oriented to person, place, and time. He has normal strength and normal reflexes. No cranial nerve deficit or sensory deficit. Coordination and gait normal.  Skin: Skin is warm, dry and  intact. No lesion and no rash noted.  Psychiatric: He has a normal mood and affect. His speech is normal and behavior is normal. Judgment and thought content normal.   Wt Readings from Last 3 Encounters:  07/27/14 252 lb (114.306 kg)  01/26/14 245 lb 6.4 oz (111.313 kg)  07/28/13 244 lb 8 oz (110.904 kg)  BP 142/86 mmHg  Pulse 66  Temp(Src) 98.1 F (36.7 C)  Resp 16  Ht 5' 9.75"  (1.772 m)  Wt 252 lb (114.306 kg)  BMI 36.40 kg/m2  SpO2 98%         Assessment & Plan:  PE (physical exam), annual - Plan: CBC with Differential/Platelet, Comprehensive metabolic panel, Lipid panel, IFOBT POC (occult bld, rslt in office)  Need for immunization against influenza - Plan: Flu Vaccine QUAD 36+ mos IM (Fluarix)  ADD (attention deficit disorder) - Plan: amphetamine-dextroamphetamine (ADDERALL XR) 20 MG 24 hr capsule, amphetamine-dextroamphetamine (ADDERALL XR) 20 MG 24 hr capsule, amphetamine-dextroamphetamine (ADDERALL XR) 20 MG 24 hr capsule  BMI 34.0-34.9,adult  Insomnia - Plan: zolpidem (AMBIEN CR) 12.5 MG CR tablet  Hypogonadism male - Plan: PSA, Testosterone, free, total  Meds ordered this encounter  Medications  . amphetamine-dextroamphetamine (ADDERALL XR) 20 MG 24 hr capsule    Sig: Take 1 capsule (20 mg total) by mouth every morning. For 30d from signing    Dispense:  30 capsule    Refill:  0  . amphetamine-dextroamphetamine (ADDERALL XR) 20 MG 24 hr capsule    Sig: Take 1 capsule (20 mg total) by mouth every morning. For 60d from signing    Dispense:  30 capsule    Refill:  0  . amphetamine-dextroamphetamine (ADDERALL XR) 20 MG 24 hr capsule    Sig: Take 1 capsule (20 mg total) by mouth every morning.    Dispense:  30 capsule    Refill:  0  . zolpidem (AMBIEN CR) 12.5 MG CR tablet    Sig: TAKE 1 TABLET BY MOUTH  AT BEDTIME AS NEEDED FOR SLEEP    Dispense:  15 tablet    Refill:  5   Follow-up one year///call in 3 months for refills and recheck Adderall in 6 months

## 2014-07-28 ENCOUNTER — Encounter: Payer: Self-pay | Admitting: Internal Medicine

## 2014-07-28 LAB — COMPREHENSIVE METABOLIC PANEL
ALBUMIN: 4.6 g/dL (ref 3.5–5.2)
ALT: 20 U/L (ref 0–53)
AST: 17 U/L (ref 0–37)
Alkaline Phosphatase: 44 U/L (ref 39–117)
BUN: 14 mg/dL (ref 6–23)
CALCIUM: 9.5 mg/dL (ref 8.4–10.5)
CHLORIDE: 101 meq/L (ref 96–112)
CO2: 28 mEq/L (ref 19–32)
Creat: 1.28 mg/dL (ref 0.50–1.35)
GLUCOSE: 88 mg/dL (ref 70–99)
POTASSIUM: 4.6 meq/L (ref 3.5–5.3)
Sodium: 139 mEq/L (ref 135–145)
Total Bilirubin: 0.8 mg/dL (ref 0.2–1.2)
Total Protein: 7.4 g/dL (ref 6.0–8.3)

## 2014-07-28 LAB — LIPID PANEL
CHOLESTEROL: 154 mg/dL (ref 0–200)
HDL: 39 mg/dL — AB (ref >39)
LDL CALC: 97 mg/dL (ref 0–99)
Total CHOL/HDL Ratio: 3.9 Ratio
Triglycerides: 88 mg/dL (ref <150)
VLDL: 18 mg/dL (ref 0–40)

## 2014-07-28 LAB — TESTOSTERONE, FREE, TOTAL, SHBG
SEX HORMONE BINDING: 14 nmol/L (ref 10–50)
TESTOSTERONE FREE: 56.6 pg/mL (ref 47.0–244.0)
Testosterone-% Free: 2.8 % (ref 1.6–2.9)
Testosterone: 199 ng/dL — ABNORMAL LOW (ref 300–890)

## 2014-07-28 LAB — PSA: PSA: 1.35 ng/mL (ref <=4.00)

## 2015-01-25 ENCOUNTER — Ambulatory Visit: Payer: BLUE CROSS/BLUE SHIELD | Admitting: Internal Medicine

## 2015-02-22 ENCOUNTER — Encounter: Payer: Self-pay | Admitting: Internal Medicine

## 2015-02-22 ENCOUNTER — Ambulatory Visit (INDEPENDENT_AMBULATORY_CARE_PROVIDER_SITE_OTHER): Payer: BLUE CROSS/BLUE SHIELD | Admitting: Internal Medicine

## 2015-02-22 VITALS — BP 134/85 | HR 67 | Temp 98.8°F | Resp 16 | Ht 70.2 in | Wt 244.0 lb

## 2015-02-22 DIAGNOSIS — R6882 Decreased libido: Secondary | ICD-10-CM | POA: Diagnosis not present

## 2015-02-22 DIAGNOSIS — Z6834 Body mass index (BMI) 34.0-34.9, adult: Secondary | ICD-10-CM

## 2015-02-22 DIAGNOSIS — F909 Attention-deficit hyperactivity disorder, unspecified type: Secondary | ICD-10-CM

## 2015-02-22 DIAGNOSIS — Z23 Encounter for immunization: Secondary | ICD-10-CM

## 2015-02-22 DIAGNOSIS — G4700 Insomnia, unspecified: Secondary | ICD-10-CM

## 2015-02-22 DIAGNOSIS — E291 Testicular hypofunction: Secondary | ICD-10-CM | POA: Diagnosis not present

## 2015-02-22 DIAGNOSIS — R7989 Other specified abnormal findings of blood chemistry: Secondary | ICD-10-CM

## 2015-02-22 DIAGNOSIS — F988 Other specified behavioral and emotional disorders with onset usually occurring in childhood and adolescence: Secondary | ICD-10-CM

## 2015-02-22 MED ORDER — AMPHETAMINE-DEXTROAMPHET ER 20 MG PO CP24: 20.0000 mg | ORAL_CAPSULE | ORAL | Status: DC

## 2015-02-22 NOTE — Progress Notes (Signed)
Subjective:    Patient ID: Ricky Bentley, male    DOB: Mar 17, 1966, 49 y.o.   MRN: [id number]  HPI The patient presents today for follow up of ADD. He is doing well on his current medications. Takes adderall most days and this enables him to focus with his job.   He was previously diagnosed with low testosterone level (199) and used Androgel for about 6 months and could not tell a difference. Has been off for 6 months. He continues to have decreased libido. He works in Research officer, political party, is married and has 2 sons (12,14).   Is back to regular exercise. Is doing some increased intensity exercise.   Review of Systems No chest pain, no SOB, no palpitations. Continued difficulty staying asleep- occasionally takes Ambien.     Objective:   Physical Exam Physical Exam  Constitutional: Oriented to person, place, and time. He appears well-developed and well-nourished.  HENT:  Head: Normocephalic and atraumatic.  Eyes: Conjunctivae are normal.  Neck: Normal range of motion. Neck supple.  Cardiovascular: Normal rate, regular rhythm and normal heart sounds.   Pulmonary/Chest: Effort normal and breath sounds normal.  Musculoskeletal: Normal range of motion.  Neurological: Alert and oriented to person, place, and time.  Skin: Skin is warm and dry.  Psychiatric: Normal mood and affect. Behavior is normal. Judgment and thought content normal.  Vitals reviewed. BP 134/85 mmHg  Pulse 67  Temp(Src) 98.8 F (37.1 C) (Oral)  Resp 16  Ht 5' 10.2" (1.783 m)  Wt 244 lb (110.678 kg)  BMI 34.81 kg/m2 Wt Readings from Last 3 Encounters:  02/22/15 244 lb (110.678 kg)  07/27/14 252 lb (114.306 kg)  01/26/14 245 lb 6.4 oz (111.313 kg)      Assessment & Plan:  1. Influenza vaccine needed - Flu Vaccine QUAD 36+ mos IM  2. ADD (attention deficit disorder) - amphetamine-dextroamphetamine (ADDERALL XR) 20 MG 24 hr capsule; Take 1 capsule (20 mg total) by mouth every morning. For 30d from signing   Dispense: 30 capsule; Refill: 0 - amphetamine-dextroamphetamine (ADDERALL XR) 20 MG 24 hr capsule; Take 1 capsule (20 mg total) by mouth every morning. For 60d from signing  Dispense: 30 capsule; Refill: 0 - amphetamine-dextroamphetamine (ADDERALL XR) 20 MG 24 hr capsule; Take 1 capsule (20 mg total) by mouth every morning.  Dispense: 30 capsule; Refill: 0  3. BMI 34.0-34.9,adult - encouraged continued exercise/healthy food choices  4. Low libido - Discussed option of endocrine/urology referral vs. Recheck of testosterone level. Patient would like to recheck levels and will come back for an am blood draw.  - Testosterone, Free, Total, SHBG; Future  5. Low testosterone - Testosterone, Free, Total, SHBG; Future  6. Insomnia - Continue Ambien  Olean Ree, FNP-BC  [facility], [facility]  02/22/2015 1:45 PM I have participated in the care of this patient with the Advanced Practice Provider and agree with Diagnosis and Plan as documented. Robert P. Merla Riches, M.D. Meds ordered this encounter  Medications  . amphetamine-dextroamphetamine (ADDERALL XR) 20 MG 24 hr capsule    Sig: Take 1 capsule (20 mg total) by mouth every morning. For 30d from signing    Dispense:  30 capsule    Refill:  0    Order Specific Question:  Supervising Provider    Answer:  Ethelda Chick [[id number]]  . amphetamine-dextroamphetamine (ADDERALL XR) 20 MG 24 hr capsule    Sig: Take 1 capsule (20 mg total) by mouth every  morning. For 60d from signing    Dispense:  30 capsule    Refill:  0    Order Specific Question:  Supervising Provider    Answer:  Ethelda Chick [[id number]]  . amphetamine-dextroamphetamine (ADDERALL XR) 20 MG 24 hr capsule    Sig: Take 1 capsule (20 mg total) by mouth every morning.    Dispense:  30 capsule    Refill:  0    Order Specific Question:  Supervising Provider    Answer:  Ethelda Chick [[id number]]   Ambien CR was called in later

## 2015-02-23 ENCOUNTER — Telehealth: Payer: Self-pay

## 2015-02-23 DIAGNOSIS — G4700 Insomnia, unspecified: Secondary | ICD-10-CM

## 2015-02-23 MED ORDER — ZOLPIDEM TARTRATE ER 12.5 MG PO TBCR
EXTENDED_RELEASE_TABLET | ORAL | Status: DC
Start: 2015-02-23 — End: 2015-08-23

## 2015-02-23 NOTE — Telephone Encounter (Signed)
Call in  Meds ordered this encounter  Medications  . zolpidem (AMBIEN CR) 12.5 MG CR tablet    Sig: TAKE 1 TABLET BY MOUTH  AT BEDTIME AS NEEDED FOR SLEEP    Dispense:  15 tablet    Refill:  5

## 2015-02-23 NOTE — Telephone Encounter (Signed)
Patient did not receive his refill request for zolpidem (AMBIEN CR) 12.5 MG CR tablet During yesterdays OV.   Please call and advise patient the statis of his request.   [phone]

## 2015-02-24 NOTE — Telephone Encounter (Signed)
Rx called in. Pt notified. 

## 2015-05-15 ENCOUNTER — Encounter: Payer: Self-pay | Admitting: Internal Medicine

## 2015-08-23 ENCOUNTER — Encounter: Payer: Self-pay | Admitting: Internal Medicine

## 2015-08-23 ENCOUNTER — Ambulatory Visit (INDEPENDENT_AMBULATORY_CARE_PROVIDER_SITE_OTHER): Payer: PRIVATE HEALTH INSURANCE | Admitting: Internal Medicine

## 2015-08-23 VITALS — BP 124/88 | HR 64 | Temp 98.1°F | Resp 16 | Ht 70.0 in | Wt 254.0 lb

## 2015-08-23 DIAGNOSIS — E291 Testicular hypofunction: Secondary | ICD-10-CM | POA: Diagnosis not present

## 2015-08-23 DIAGNOSIS — G4700 Insomnia, unspecified: Secondary | ICD-10-CM

## 2015-08-23 DIAGNOSIS — F988 Other specified behavioral and emotional disorders with onset usually occurring in childhood and adolescence: Secondary | ICD-10-CM

## 2015-08-23 DIAGNOSIS — Z6834 Body mass index (BMI) 34.0-34.9, adult: Secondary | ICD-10-CM

## 2015-08-23 DIAGNOSIS — R7989 Other specified abnormal findings of blood chemistry: Secondary | ICD-10-CM

## 2015-08-23 DIAGNOSIS — F909 Attention-deficit hyperactivity disorder, unspecified type: Secondary | ICD-10-CM

## 2015-08-23 MED ORDER — AMPHETAMINE-DEXTROAMPHETAMINE 10 MG PO TABS: 10.0000 mg | ORAL_TABLET | Freq: Every day | ORAL | Status: DC

## 2015-08-23 MED ORDER — AMPHETAMINE-DEXTROAMPHET ER 20 MG PO CP24: 20.0000 mg | ORAL_CAPSULE | ORAL | Status: DC

## 2015-08-23 MED ORDER — ZOLPIDEM TARTRATE ER 12.5 MG PO TBCR: EXTENDED_RELEASE_TABLET | ORAL | Status: DC

## 2015-08-23 NOTE — Patient Instructions (Signed)
[facility]--Dr Elisabeth MostStevenson for ADD meds

## 2015-08-23 NOTE — Progress Notes (Signed)
F/u for meds Patient Active Problem List   Diagnosis Date Noted  . ADD (attention deficit disorder) 11/27/2011    Priority: Medium  . Hypogonadism male 04/26/2013  . Low testosterone 02/19/2013  . Erectile dysfunction 02/19/2013  . BMI 34.0-34.9,adult 11/27/2011  . Insomnia 11/27/2011   S/p 2nd achilles tend repair and healing well. Restarting yoga and walking. Athletic family. 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  He still needs occasional doses of sleep medication to help him unwind  ROS otherwise neg    Ex BP 124/88 mmHg  Pulse 64  Temp(Src) 98.1 F (36.7 C)  Resp 16  Ht 5\' 10"  (1.778 m)  Wt 254 lb (115.214 kg)  BMI 36.45 kg/m2 HEENT clear Heart regular Neurological intact Mood good affect appropriate  Impression 1 attention deficit disorder --add 10mg  add at 4pm to long workdays  2 overweight --disc slow activ incr as diet is pretty good  3 low testos -no rx  Meds ordered this encounter  Medications  . amphetamine-dextroamphetamine (ADDERALL XR) 20 MG 24 hr capsule    Sig: Take 1 capsule (20 mg total) by mouth every morning.    Dispense:  30 capsule    Refill:  0  . amphetamine-dextroamphetamine (ADDERALL XR) 20 MG 24 hr capsule    Sig: Take 1 capsule (20 mg total) by mouth every morning. For 60d from signing    Dispense:  30 capsule    Refill:  0  . amphetamine-dextroamphetamine (ADDERALL XR) 20 MG 24 hr capsule    Sig: Take 1 capsule (20 mg total) by mouth every morning. For 30d from signing    Dispense:  30 capsule    Refill:  0  . zolpidem (AMBIEN CR) 12.5 MG CR tablet    Sig: TAKE 1 TABLET BY MOUTH  AT BEDTIME AS NEEDED FOR SLEEP    Dispense:  15 tablet    Refill:  5  . amphetamine-dextroamphetamine (ADDERALL) 10 MG tablet    Sig: Take 1 tablet (10 mg total) by mouth  daily. 4pm    Dispense:  30 tablet    Refill:  0  . amphetamine-dextroamphetamine (ADDERALL) 10 MG tablet    Sig: Take 1 tablet (10 mg total) by mouth daily. At 4pm. For 30d after signed    Dispense:  30 tablet    Refill:  0  . amphetamine-dextroamphetamine (ADDERALL) 10 MG tablet    Sig: Take 1 tablet (10 mg total) by mouth daily. At 4pm. For 60d after signed    Dispense:  30 tablet    Refill:  0   Patient Instructions  [facility]--Dr Elisabeth MostStevenson for ADD meds

## 2019-06-02 ENCOUNTER — Other Ambulatory Visit (HOSPITAL_COMMUNITY): Payer: Self-pay | Admitting: Internal Medicine

## 2019-06-02 ENCOUNTER — Telehealth (HOSPITAL_COMMUNITY): Payer: Self-pay

## 2019-06-02 ENCOUNTER — Other Ambulatory Visit (HOSPITAL_COMMUNITY): Payer: Self-pay | Admitting: Geriatric Medicine

## 2019-06-02 DIAGNOSIS — R0609 Other forms of dyspnea: Secondary | ICD-10-CM

## 2019-06-02 NOTE — Telephone Encounter (Signed)
Encounter complete. 

## 2019-06-03 ENCOUNTER — Ambulatory Visit (HOSPITAL_COMMUNITY): Admission: RE | Admit: 2019-06-03 | Payer: 59 | Source: Ambulatory Visit

## 2019-06-03 ENCOUNTER — Inpatient Hospital Stay (HOSPITAL_COMMUNITY): Payer: 59

## 2019-06-03 ENCOUNTER — Other Ambulatory Visit: Payer: Self-pay

## 2019-06-03 ENCOUNTER — Emergency Department (HOSPITAL_COMMUNITY): Payer: 59

## 2019-06-03 ENCOUNTER — Inpatient Hospital Stay (HOSPITAL_COMMUNITY)
Admission: EM | Admit: 2019-06-03 | Discharge: 2019-06-07 | DRG: 166 | Disposition: A | Discharge status: Home / Self Care | Payer: 59 | Attending: Internal Medicine | Admitting: Internal Medicine

## 2019-06-03 ENCOUNTER — Encounter (HOSPITAL_COMMUNITY): Payer: Self-pay

## 2019-06-03 DIAGNOSIS — Z6833 Body mass index (BMI) 33.0-33.9, adult: Secondary | ICD-10-CM

## 2019-06-03 DIAGNOSIS — E66811 Obesity, class 1: Secondary | ICD-10-CM

## 2019-06-03 DIAGNOSIS — I50811 Acute right heart failure: Secondary | ICD-10-CM | POA: Diagnosis not present

## 2019-06-03 DIAGNOSIS — Z833 Family history of diabetes mellitus: Secondary | ICD-10-CM | POA: Diagnosis not present

## 2019-06-03 DIAGNOSIS — R0600 Dyspnea, unspecified: Secondary | ICD-10-CM | POA: Diagnosis not present

## 2019-06-03 DIAGNOSIS — J9601 Acute respiratory failure with hypoxia: Secondary | ICD-10-CM

## 2019-06-03 DIAGNOSIS — E669 Obesity, unspecified: Secondary | ICD-10-CM | POA: Diagnosis present

## 2019-06-03 DIAGNOSIS — E291 Testicular hypofunction: Secondary | ICD-10-CM | POA: Diagnosis present

## 2019-06-03 DIAGNOSIS — I2609 Other pulmonary embolism with acute cor pulmonale: Secondary | ICD-10-CM | POA: Diagnosis not present

## 2019-06-03 DIAGNOSIS — N1831 Chronic kidney disease, stage 3a: Secondary | ICD-10-CM | POA: Diagnosis present

## 2019-06-03 DIAGNOSIS — R0602 Shortness of breath: Secondary | ICD-10-CM

## 2019-06-03 DIAGNOSIS — I10 Essential (primary) hypertension: Secondary | ICD-10-CM | POA: Diagnosis present

## 2019-06-03 DIAGNOSIS — Z8249 Family history of ischemic heart disease and other diseases of the circulatory system: Secondary | ICD-10-CM

## 2019-06-03 DIAGNOSIS — Z832 Family history of diseases of the blood and blood-forming organs and certain disorders involving the immune mechanism: Secondary | ICD-10-CM | POA: Diagnosis not present

## 2019-06-03 DIAGNOSIS — I129 Hypertensive chronic kidney disease with stage 1 through stage 4 chronic kidney disease, or unspecified chronic kidney disease: Secondary | ICD-10-CM | POA: Diagnosis present

## 2019-06-03 DIAGNOSIS — I2699 Other pulmonary embolism without acute cor pulmonale: Principal | ICD-10-CM | POA: Diagnosis present

## 2019-06-03 DIAGNOSIS — J189 Pneumonia, unspecified organism: Secondary | ICD-10-CM | POA: Diagnosis present

## 2019-06-03 DIAGNOSIS — R0609 Other forms of dyspnea: Secondary | ICD-10-CM

## 2019-06-03 DIAGNOSIS — Z20828 Contact with and (suspected) exposure to other viral communicable diseases: Secondary | ICD-10-CM | POA: Diagnosis present

## 2019-06-03 DIAGNOSIS — N183 Chronic kidney disease, stage 3 unspecified: Secondary | ICD-10-CM | POA: Diagnosis present

## 2019-06-03 HISTORY — PX: IR US GUIDE VASC ACCESS RIGHT: IMG2390

## 2019-06-03 HISTORY — DX: Essential (primary) hypertension: I10

## 2019-06-03 HISTORY — PX: IR INFUSION THROMBOL ARTERIAL INITIAL (MS): IMG5376

## 2019-06-03 HISTORY — DX: Obesity, unspecified: E66.9

## 2019-06-03 HISTORY — PX: IR ANGIOGRAM SELECTIVE EACH ADDITIONAL VESSEL: IMG667

## 2019-06-03 HISTORY — PX: IR ANGIOGRAM PULMONARY BILATERAL SELECTIVE: IMG664

## 2019-06-03 HISTORY — DX: Obesity, class 1: E66.811

## 2019-06-03 LAB — BRAIN NATRIURETIC PEPTIDE: B Natriuretic Peptide: 589.8 pg/mL — ABNORMAL HIGH (ref 0.0–100.0)

## 2019-06-03 LAB — PROCALCITONIN: Procalcitonin: <0.1 ng/mL

## 2019-06-03 LAB — PROTIME-INR
INR: 1 (ref 0.8–1.2)
Prothrombin Time: 13.3 seconds (ref 11.4–15.2)

## 2019-06-03 LAB — CBC
HCT: 45.9 % (ref 39.0–52.0)
Hemoglobin: 15.4 g/dL (ref 13.0–17.0)
MCH: 30.1 pg (ref 26.0–34.0)
MCHC: 33.6 g/dL (ref 30.0–36.0)
MCV: 89.6 fL (ref 80.0–100.0)
Platelets: 272 10*3/uL (ref 150–400)
RBC: 5.12 MIL/uL (ref 4.22–5.81)
RDW: 13.2 % (ref 11.5–15.5)
WBC: 9.6 10*3/uL (ref 4.0–10.5)
nRBC: 0 % (ref 0.0–0.2)

## 2019-06-03 LAB — CBC WITH DIFFERENTIAL/PLATELET
Abs Immature Granulocytes: 0.02 10*3/uL (ref 0.00–0.07)
Basophils Absolute: 0.1 10*3/uL (ref 0.0–0.1)
Basophils Relative: 1 %
Eosinophils Absolute: 0 10*3/uL (ref 0.0–0.5)
Eosinophils Relative: 0 %
HCT: 49.5 % (ref 39.0–52.0)
Hemoglobin: 16.6 g/dL (ref 13.0–17.0)
Immature Granulocytes: 0 %
Lymphocytes Relative: 20 %
Lymphs Abs: 2 10*3/uL (ref 0.7–4.0)
MCH: 30.1 pg (ref 26.0–34.0)
MCHC: 33.5 g/dL (ref 30.0–36.0)
MCV: 89.7 fL (ref 80.0–100.0)
Monocytes Absolute: 0.8 10*3/uL (ref 0.1–1.0)
Monocytes Relative: 8 %
Neutro Abs: 6.9 10*3/uL (ref 1.7–7.7)
Neutrophils Relative %: 71 %
Platelets: 291 10*3/uL (ref 150–400)
RBC: 5.52 MIL/uL (ref 4.22–5.81)
RDW: 13.2 % (ref 11.5–15.5)
WBC: 9.7 10*3/uL (ref 4.0–10.5)
nRBC: 0 % (ref 0.0–0.2)

## 2019-06-03 LAB — BASIC METABOLIC PANEL
Anion gap: 12 (ref 5–15)
BUN: 25 mg/dL — ABNORMAL HIGH (ref 6–20)
CO2: 22 mmol/L (ref 22–32)
Calcium: 9.1 mg/dL (ref 8.9–10.3)
Chloride: 103 mmol/L (ref 98–111)
Creatinine, Ser: 1.69 mg/dL — ABNORMAL HIGH (ref 0.61–1.24)
GFR calc Af Amer: 53 mL/min — ABNORMAL LOW (ref >60)
GFR calc non Af Amer: 46 mL/min — ABNORMAL LOW (ref >60)
Glucose, Bld: 116 mg/dL — ABNORMAL HIGH (ref 70–99)
Potassium: 4 mmol/L (ref 3.5–5.1)
Sodium: 137 mmol/L (ref 135–145)

## 2019-06-03 LAB — TROPONIN I (HIGH SENSITIVITY)
Troponin I (High Sensitivity): 72 ng/L — ABNORMAL HIGH (ref <18)
Troponin I (High Sensitivity): 80 ng/L — ABNORMAL HIGH (ref <18)

## 2019-06-03 LAB — EXPECTORATED SPUTUM ASSESSMENT W GRAM STAIN, RFLX TO RESP C

## 2019-06-03 LAB — STREP PNEUMONIAE URINARY ANTIGEN: Strep Pneumo Urinary Antigen: NEGATIVE

## 2019-06-03 LAB — SARS CORONAVIRUS 2 (TAT 6-24 HRS): SARS Coronavirus 2: NEGATIVE

## 2019-06-03 LAB — MAGNESIUM: Magnesium: 2.1 mg/dL (ref 1.7–2.4)

## 2019-06-03 LAB — HEPARIN LEVEL (UNFRACTIONATED)
Heparin Unfractionated: 0.17 IU/mL — ABNORMAL LOW (ref 0.30–0.70)
Heparin Unfractionated: 0.11 IU/mL — ABNORMAL LOW (ref 0.30–0.70)

## 2019-06-03 LAB — POC SARS CORONAVIRUS 2 AG -  ED: SARS Coronavirus 2 Ag: NEGATIVE

## 2019-06-03 LAB — MRSA PCR SCREENING: MRSA by PCR: NEGATIVE

## 2019-06-03 LAB — ECHOCARDIOGRAM COMPLETE
Height: 70 in
Weight: 3680 oz

## 2019-06-03 LAB — FIBRINOGEN: Fibrinogen: 526 mg/dL — ABNORMAL HIGH (ref 210–475)

## 2019-06-03 LAB — APTT: aPTT: 37 seconds — ABNORMAL HIGH (ref 24–36)

## 2019-06-03 MED ORDER — SODIUM CHLORIDE 0.9 % IV SOLN: INTRAVENOUS | Status: DC

## 2019-06-03 MED ORDER — SODIUM CHLORIDE 0.9 % IV SOLN
1.0000 g | Freq: Once | INTRAVENOUS | Status: AC
  Administered 2019-06-03: 1 g via INTRAVENOUS
  Filled 2019-06-03: qty 10

## 2019-06-03 MED ORDER — ACETAMINOPHEN 325 MG PO TABS
650.0000 mg | ORAL_TABLET | Freq: Four times a day (QID) | ORAL | Status: DC | PRN
  Administered 2019-06-04: 02:00:00 650 mg via ORAL
  Filled 2019-06-03: qty 2

## 2019-06-03 MED ORDER — SODIUM CHLORIDE 0.9 % IV SOLN
12.0000 mg | Freq: Once | INTRAVENOUS | Status: AC
  Administered 2019-06-03: 12 mg via INTRAVENOUS
  Filled 2019-06-03: qty 12

## 2019-06-03 MED ORDER — SODIUM CHLORIDE 0.9 % IV SOLN
2.0000 g | INTRAVENOUS | Status: DC
  Administered 2019-06-04 – 2019-06-07 (×4): 2 g via INTRAVENOUS
  Filled 2019-06-03 (×4): qty 2

## 2019-06-03 MED ORDER — SODIUM CHLORIDE 0.9 % IV BOLUS
500.0000 mL | Freq: Once | INTRAVENOUS | Status: AC
  Administered 2019-06-03: 500 mL via INTRAVENOUS

## 2019-06-03 MED ORDER — CHLORHEXIDINE GLUCONATE CLOTH 2 % EX PADS
6.0000 | MEDICATED_PAD | Freq: Every day | CUTANEOUS | Status: DC
  Administered 2019-06-04 – 2019-06-07 (×3): 6 via TOPICAL

## 2019-06-03 MED ORDER — SODIUM CHLORIDE 0.9% FLUSH: 3.0000 mL | INTRAVENOUS | Status: DC | PRN

## 2019-06-03 MED ORDER — FENTANYL CITRATE (PF) 100 MCG/2ML IJ SOLN
INTRAMUSCULAR | Status: AC | PRN
  Administered 2019-06-03: 50 ug via INTRAVENOUS
  Administered 2019-06-03: 25 ug via INTRAVENOUS

## 2019-06-03 MED ORDER — MIDAZOLAM HCL 2 MG/2ML IJ SOLN
INTRAMUSCULAR | Status: AC | PRN
  Administered 2019-06-03: 0.5 mg via INTRAVENOUS
  Administered 2019-06-03: 1 mg via INTRAVENOUS

## 2019-06-03 MED ORDER — PROCHLORPERAZINE EDISYLATE 10 MG/2ML IJ SOLN: 5.0000 mg | Freq: Four times a day (QID) | INTRAMUSCULAR | Status: DC | PRN

## 2019-06-03 MED ORDER — ACETAMINOPHEN 650 MG RE SUPP: 650.0000 mg | Freq: Four times a day (QID) | RECTAL | Status: DC | PRN

## 2019-06-03 MED ORDER — FENTANYL CITRATE (PF) 100 MCG/2ML IJ SOLN
INTRAMUSCULAR | Status: AC
  Filled 2019-06-03: qty 2

## 2019-06-03 MED ORDER — HEPARIN (PORCINE) 25000 UT/250ML-% IV SOLN
2200.0000 [IU]/h | INTRAVENOUS | Status: AC
  Administered 2019-06-03 – 2019-06-04 (×2): 1900 [IU]/h via INTRAVENOUS
  Filled 2019-06-03 (×2): qty 250

## 2019-06-03 MED ORDER — SODIUM CHLORIDE 0.9 % IV SOLN
INTRAVENOUS | Status: AC
  Filled 2019-06-03: qty 500

## 2019-06-03 MED ORDER — HEPARIN BOLUS VIA INFUSION
4000.0000 [IU] | Freq: Once | INTRAVENOUS | Status: AC
  Administered 2019-06-03: 4000 [IU] via INTRAVENOUS
  Filled 2019-06-03: qty 4000

## 2019-06-03 MED ORDER — SODIUM CHLORIDE 0.9% FLUSH
3.0000 mL | Freq: Two times a day (BID) | INTRAVENOUS | Status: DC
  Administered 2019-06-03 – 2019-06-06 (×6): 3 mL via INTRAVENOUS

## 2019-06-03 MED ORDER — SODIUM CHLORIDE 0.9 % IV SOLN: 250.0000 mL | INTRAVENOUS | Status: DC | PRN

## 2019-06-03 MED ORDER — LIDOCAINE HCL 1 % IJ SOLN
INTRAMUSCULAR | Status: AC
  Filled 2019-06-03: qty 20

## 2019-06-03 MED ORDER — MIDAZOLAM HCL 2 MG/2ML IJ SOLN
INTRAMUSCULAR | Status: AC
  Filled 2019-06-03: qty 2

## 2019-06-03 MED ORDER — SODIUM CHLORIDE 0.9 % IV SOLN
12.0000 mg | Freq: Once | INTRAVENOUS | Status: AC
  Administered 2019-06-03: 19:00:00 12 mg via INTRAVENOUS
  Filled 2019-06-03: qty 12

## 2019-06-03 MED ORDER — HEPARIN (PORCINE) 25000 UT/250ML-% IV SOLN
1600.0000 [IU]/h | INTRAVENOUS | Status: DC
  Administered 2019-06-03: 1600 [IU]/h via INTRAVENOUS
  Filled 2019-06-03: qty 250

## 2019-06-03 MED ORDER — SODIUM CHLORIDE 0.9 % IV SOLN
500.0000 mg | INTRAVENOUS | Status: DC
  Administered 2019-06-04: 500 mg via INTRAVENOUS
  Filled 2019-06-03: qty 500

## 2019-06-03 MED ORDER — SODIUM CHLORIDE 0.9 % IV SOLN
500.0000 mg | Freq: Once | INTRAVENOUS | Status: AC
  Administered 2019-06-03: 500 mg via INTRAVENOUS
  Filled 2019-06-03: qty 500

## 2019-06-03 MED ORDER — IOHEXOL 350 MG/ML SOLN
100.0000 mL | Freq: Once | INTRAVENOUS | Status: AC | PRN
  Administered 2019-06-03: 100 mL via INTRAVENOUS

## 2019-06-03 MED ORDER — MAGNESIUM SULFATE 2 GM/50ML IV SOLN
2.0000 g | Freq: Once | INTRAVENOUS | Status: AC
  Administered 2019-06-03: 16:00:00 2 g via INTRAVENOUS
  Filled 2019-06-03: qty 50

## 2019-06-03 MED ORDER — SODIUM CHLORIDE (PF) 0.9 % IJ SOLN
INTRAMUSCULAR | Status: AC
  Filled 2019-06-03: qty 50

## 2019-06-03 MED ORDER — SODIUM CHLORIDE 0.9 % IV SOLN
INTRAVENOUS | Status: AC | PRN
  Administered 2019-06-03: 10 mL/h via INTRAVENOUS

## 2019-06-03 NOTE — Progress Notes (Signed)
  Echocardiogram 2D Echocardiogram has been performed.  Ricky Bentley 06/03/2019, 1:37 PM

## 2019-06-03 NOTE — Procedures (Signed)
Interventional Radiology Procedure Note  Procedure: US guided right CFV access x 2, for initiation of catheter directed lysis of bilateral PE.   Orange/382F sheath transmits the right catheter.  Unifuse 90cm x 15cm infusion Green/82F sheath transmits the left catheter.  Unifuse 90cm x 40CX infusion   Complications: None  Recommendations:  - Q 6 hr CBC, heparin level, and fibrinogen level - observe overnight in the ICU, with frequent NV checks - heparin per pharmacy, thank you - right hip straight with sheaths in place. - Each catheter will receive 1mg /hr of tPA x 12 hours, for a total of 24 mg tPA total.  - each sheath will receive KVO saline infusion - Do not submerge  Signed,  Dulcy Fanny. Earleen Newport, DO

## 2019-06-03 NOTE — ED Triage Notes (Signed)
Patient states he began having SOB last week and cough started yesterday. Patient states he had chest pressure on the right yesterday. SOB worse when laying flat. Patient states he had a low grade temp 100.2 4 days ago. Sats 86-87% last night. SAts in triage-96% room air.

## 2019-06-03 NOTE — Sedation Documentation (Signed)
Main Pulmonary artery 76/20(41)

## 2019-06-03 NOTE — H&P (Signed)
History and Physical    Ricky Bentley ZOX:[id number] DOB: 1965/11/13 DOA: 06/03/2019  PCP: Tonye Pearson, MD  Patient coming from: Home.  I have personally briefly reviewed patient's old medical records in [facility]  Chief Complaint: Shortness of breath.  HPI: Ricky Bentley is a 53 y.o. male with medical history significant of hypertension, obesity, hypogonadism ADD who is coming to the emergency department with progressively worse dyspnea particularly on exertion, mild dizziness, palpitations which started 8 days ago as he was walking up the stairs of his home.  Last night, the patient started coughing yellowish sputum, had a fever, chills, night sweats and malaise.  He presented to the ED after his O2 sats were low in the 80s at home.  He has not been sitting for prolonged periods at times and hot not travel anywhere.  He denies personal or family history of thrombosis, but later remembered he had a brother with DVT.  He gets monthly injections so testosterone.  He denies chest pain, PND, orthopnea or pitting edema of the lower extremities.  No abdominal pain, nausea or emesis, diarrhea, constipation, melena or hematochezia.  No dysuria, frequency or hematuria.  Denies polyuria, polydipsia, polyphagia or blurred vision.  ED Course: Initial vital signs temperature 97.3, pulse 108, respirations 20, blood pressure 156/113 mmHg and O2 sat 94% on room air.    CBC was normal.  SARS was negative.  Troponin I was 72 than 80 ng/L.  PT was 13.3, INR 1.0 and PTT 37.  BNP was 589.8 pg milliliter.  BMP shows normal electrolytes.  Glucose 116, BUN 25 and creatinine 1.69 mg/dL.  Baseline creatinine as above 0.3 2.4 lower.  Imaging significant for: Extensive bilateral pulmonary embolus with pulmonary emboli extending from the main pulmonary outflow tracks throughout the upper and lower lobe pulmonary artery systems bilaterally. Positive for acute PE with CT evidence of right heart strain  (RV/LV Ratio = 2.3) consistent with at least submassive (intermediate risk) PE.   Review of Systems: As per HPI otherwise 10 point review of systems negative.   Past Medical History:  Diagnosis Date  . Hypertension 06/03/2019  . Obesity (BMI 30.0-34.9) 06/03/2019    Past Surgical History:  Procedure Laterality Date  . ACHILLES TENDON SURGERY       reports that he has never smoked. He has never used smokeless tobacco. He reports current alcohol use. He reports that he does not use drugs.  No Known Allergies  Family History  Problem Relation Age of Onset  . Diabetes Mother   . Diabetes Maternal Grandmother   . Heart disease Maternal Grandmother   . Diabetes Maternal Grandfather   . Cancer Paternal Grandmother        pancreatic cancer  . Heart disease Paternal Grandfather    Prior to Admission medications   Medication Sig Start Date End Date Taking? Authorizing Provider  hydrochlorothiazide (HYDRODIURIL) 25 MG tablet Take 25 mg by mouth daily. 01/28/19  Yes [provider]  TESTOSTERONE CYPIONATE IM Inject into the muscle every 28 (twenty-eight) days.   Yes [provider]    Physical Exam: Vitals:   06/03/19 0932 06/03/19 1300 06/03/19 1345  BP: (!) 156/113 115/82 110/69  Pulse: (!) 108 (!) 102 100  Resp: Temp: (!) 97.3 F (36.3 C)    TempSrc: Oral    SpO2: 94% 93% 94%  Weight: 104.3 kg    Height:  (1.778 m)      Constitutional:  NAD, calm, comfortable Eyes: PERRL, lids and conjunctivae normal ENMT: Mucous membranes are moist. Posterior pharynx clear of any exudate or lesions. Neck: normal, supple, no masses, no thyromegaly Respiratory: Decreased breath sounds on bases, otherwise clear to auscultation bilaterally, no wheezing, no crackles. Normal respiratory effort. No accessory muscle use.  Cardiovascular: Regular rate and rhythm, no murmurs / rubs / gallops. No extremity edema. 2+ pedal pulses. No carotid bruits.  Abdomen: no  tenderness, no masses palpated. No hepatosplenomegaly. Bowel sounds positive.  Musculoskeletal: no clubbing / cyanosis. Good ROM, no contractures. Normal muscle tone.  Skin: no rashes, lesions, ulcers. No induration Neurologic: CN 2-12 grossly intact. Sensation intact, DTR normal. Strength 5/5 in all 4.  Psychiatric: Normal judgment and insight. Alert and oriented x 3. Normal mood.   Labs on Admission: I have personally reviewed following labs and imaging studies  CBC: Recent Labs  Lab 06/03/19 1028  WBC 9.7  NEUTROABS 6.9  HGB 16.6  HCT 49.5  MCV 89.7  PLT 291   Basic Metabolic Panel: Recent Labs  Lab 06/03/19 1028  NA 137  K 4.0  CL 103  CO2 22  GLUCOSE 116*  BUN 25*  CREATININE 1.69*  CALCIUM 9.1   GFR: Estimated Creatinine Ricky: 62.5 mL/min (A) (by C-G formula based on SCr of 1.69 mg/dL (H)). Liver Function Tests: No results for input(s): AST, ALT, ALKPHOS, BILITOT, PROT, ALBUMIN in the last 168 hours. No results for input(s): LIPASE, AMYLASE in the last 168 hours. No results for input(s): AMMONIA in the last 168 hours. Coagulation Profile: Recent Labs  Lab 06/03/19 1215  INR 1.0   Cardiac Enzymes: No results for input(s): CKTOTAL, CKMB, CKMBINDEX, TROPONINI in the last 168 hours. BNP (last 3 results) No results for input(s): PROBNP in the last 8760 hours. HbA1C: No results for input(s): HGBA1C in the last 72 hours. CBG: No results for input(s): GLUCAP in the last 168 hours. Lipid Profile: No results for input(s): CHOL, HDL, LDLCALC, TRIG, CHOLHDL, LDLDIRECT in the last 72 hours. Thyroid Function Tests: No results for input(s): TSH, T4TOTAL, FREET4, T3FREE, THYROIDAB in the last 72 hours. Anemia Panel: No results for input(s): VITAMINB12, FOLATE, FERRITIN, TIBC, IRON, RETICCTPCT in the last 72 hours. Urine analysis:    Component Value Date/Time   COLORURINE Yellow 04/26/2013 0850   APPEARANCEUR Clear 04/26/2013 0850   LABSPEC 1.025 04/26/2013  0850   PHURINE 6.0 04/26/2013 0850   GLUCOSEU Negative 04/26/2013 0850   HGBUR Negative 04/26/2013 0850   BILIRUBINUR Negative 04/26/2013 0850   KETONESUR Negative 04/26/2013 0850   UROBILINOGEN 0.2 mg/dL 38/75/6433 2951   NITRITE Negative 04/26/2013 0850   LEUKOCYTESUR Negative 04/26/2013 0850    Radiological Exams on Admission: CT Angio Chest PE W and/or Wo Contrast  Result Date: 06/03/2019 CLINICAL DATA:  Shortness of breath EXAM: CT ANGIOGRAPHY CHEST WITH CONTRAST TECHNIQUE: Multidetector CT imaging of the chest was performed using the standard protocol during bolus administration of intravenous contrast. Multiplanar CT image reconstructions and MIPs were obtained to evaluate the vascular anatomy. CONTRAST:  OMNIPAQUE IOHEXOL 350 MG/ML SOLN COMPARISON:  Chest radiograph June 03, 2019 FINDINGS: Cardiovascular: There is extensive pulmonary embolus arising in both main pulmonary arteries, more severe on the right than on the left, with extension of pulmonary emboli into multiple upper and lower lobe pulmonary artery branches. The right ventricle to left ventricle diameter ratio is 2.3, consistent with right heart strain. There is no thoracic aortic aneurysm or dissection. The visualized great vessels appear normal.  There is a small pericardial effusion. There is no pericardial thickening. Mediastinum/Nodes: Thyroid appears normal. There is no appreciable thoracic adenopathy. No esophageal lesions are evident. Lungs/Pleura: There is extensive airspace consolidation consistent with pneumonia in the left upper lobe with involvement of portions of the anterior and posterior segments of the left upper lobe. There is more patchy infiltrate in the medial aspect of the superior segment of the right lower lobe as well as in portions of the medial and posterior segments of the left lower lobe. No pleural effusions are evident. Upper Abdomen: There is reflux of contrast into the inferior vena cava and  hepatic veins. Visualized upper abdominal structures otherwise appear unremarkable. Musculoskeletal: There is mild thoracic dextroscoliosis. There are no blastic or lytic bone lesions. No chest wall lesions are evident. Review of the MIP images confirms the above findings. IMPRESSION: 1. Extensive bilateral pulmonary embolus with pulmonary emboli extending from the main pulmonary outflow tracks throughout the upper and lower lobe pulmonary artery systems bilaterally. Positive for acute PE with CT evidence of right heart strain (RV/LV Ratio = 2.3) consistent with at least submassive (intermediate risk) PE. The presence of right heart strain has been associated with an increased risk of morbidity and mortality. Please activate Code PE by paging [phone]. 2. Extensive airspace consolidation consistent with pneumonia in portions of the left upper and left lower lobes. A small amount of infiltrate is noted in the superior segment right lower lobe. A degree of intermingled pulmonary infarct cannot be excluded by CT. 3. Reflux of contrast into the inferior vena cava and hepatic veins, a finding indicative of increased right heart pressure. 4.  Small pericardial effusion. 5.  No thoracic aortic aneurysm or dissection. 6.  No appreciable adenopathy. Critical Value/emergent results were called by telephone at the time of interpretation on 06/03/2019 at 11:55 am to providerSCOTT GOLDSTON , who verbally acknowledged these results. Electronically Signed   By: Lowella Grip III M.D.   On: 06/03/2019 11:55   DG Chest Portable 1 View  Result Date: 06/03/2019 CLINICAL DATA:  Cough and shortness of breath EXAM: PORTABLE CHEST 1 VIEW COMPARISON:  None. FINDINGS: There is an area of ill-defined increased opacity in the left upper lobe. Lungs elsewhere are clear. Heart is borderline enlarged with pulmonary vascularity normal. No adenopathy. No bone lesions. IMPRESSION: Ill-defined area of opacity in the left upper lobe,  suspicious for early pneumonia. Lungs elsewhere clear. Borderline cardiac enlargement. Electronically Signed   By: Lowella Grip III M.D.   On: 06/03/2019 10:12    EKG: Independently reviewed.  Vent. rate 107 BPM PR interval * ms QRS duration 96 ms QT/QTc 364/486 ms P-R-T axes 66 83 -44 Sinus tachycardia Nonspecific T abnormalities, anterior leads Borderline prolonged QT interval Baseline wander in lead(s) V4  Assessment/Plan Principal Problem:   Bilateral pulmonary embolism (HCC) Admit to stepdown/inpatient. Continue supplemental oxygen. Continue heparin infusion. Analgesics as needed. Critical care has been consulted. IR has been consulted.  Active Problems:   CAP (community acquired pneumonia) On oxygen. Bronchodilators as needed. Azithromycin 500 mg IVPB every 24 hours. Ceftriaxone 2 g IVPB every 24 hours. Follow blood cultures and sensitivity. Try to obtain sputum culture. Check strep pneumonia urinary antigen.    Hypertension Hold HCTZ for now. Recheck renal function in a.m. Monitor blood pressure.    Obesity (BMI 30.0-34.9) Lifestyle modification suggested.    CKD (chronic kidney disease) stage 3, GFR 30-59 ml/min Hold hydrochlorothiazide. Monitor BUN, creatinine and electrolytes    DVT prophylaxis:  On heparin. Code Status: Full code. Family Communication: Disposition Plan: Admit for bilateral PE and CAP treatment. Consults called: PCCM will consult. Admission status: Inpatient/stepdown.   Ricky Moavid Manuel Remigio Mcmillon MD [facility]  If 7PM-7AM, please contact night-coverage [email or web address]  06/03/2019, 2:27 PM   This document was prepared using Dragon voice recognition software and may contain some unintended transcription errors.

## 2019-06-03 NOTE — Consult Note (Signed)
Chief Complaint: Patient was seen in consultation today for bilateral PEs/thrombolysis.  Referring Physician(s): Chestine Spore Virl Axe (CCM)  Supervising Physician: Richarda Overlie  Patient Status: [facility] - In-pt  History of Present Illness: Ricky Bentley is a 53 y.o. male with a past medical history of hypertension, hypogonadism, obesity, and ADD. He presented to [facility] ED this AM with complaint of persistent dyspnea and cough. Per patient, he has had progressive dyspnea since last Wednesday. States he developed a cough today. In ED, CTA revealed bilateral PEs. Troponins and BNP elevated. Echo confirmed right heart strain. Patient was admitted for further management. Continuous IV heparin was started. CCM was consulted who recommended IR consultation for possible EKOS thrombolysis.  CTA chest 06/03/2019: 1. Extensive bilateral pulmonary embolus with pulmonary emboli extending from the main pulmonary outflow tracks throughout the upper and lower lobe pulmonary artery systems bilaterally. Positive for acute PE with CT evidence of right heart strain (RV/LV Ratio = 2.3) consistent with at least submassive (intermediate risk) PE. The presence of right heart strain has been associated with an increased risk of morbidity and mortality. Please activate Code PE by paging [phone]. 2. Extensive airspace consolidation consistent with pneumonia in portions of the left upper and left lower lobes. A small amount of infiltrate is noted in the superior segment right lower lobe. A degree of intermingled pulmonary infarct cannot be excluded by CT. 3. Reflux of contrast into the inferior vena cava and hepatic veins, a finding indicative of increased right heart pressure. 4.  Small pericardial effusion. 5.  No thoracic aortic aneurysm or dissection. 6.  No appreciable adenopathy.  Echocardiogram 06/03/2019:  1. Left ventricular ejection fraction, by visual estimation, is 55 to 60%. The left ventricle has hyperdynamic  function. There is mildly increased left ventricular hypertrophy.  2. Left ventricular diastolic parameters are consistent with Grade I diastolic dysfunction (impaired relaxation).  3. Right ventricular volume and pressure overload.  4. The left ventricle demonstrates regional wall motion abnormalities.  5. Global right ventricle has moderately reduced systolic function.The right ventricular size is moderately enlarged. No increase in right ventricular wall thickness.  6. Left atrial size was normal.  7. Right atrial size was normal.  8. Small pericardial effusion.  9. The pericardial effusion is circumferential. 10. The mitral valve is grossly normal. Trivial mitral valve regurgitation. 11. The tricuspid valve is grossly normal. Tricuspid valve regurgitation is trivial. 12. The aortic valve is tricuspid. Aortic valve regurgitation is not visualized. 13. The pulmonic valve was grossly normal. Pulmonic valve regurgitation is trivial. 14. Severely elevated pulmonary artery systolic pressure. 15. The inferior vena cava is dilated in size with <50% respiratory variability, suggesting right atrial pressure of 15 mmHg. 16. Findings consistent with RV strain  IR consulted by Dr. Chestine Spore for possible image-guided pulmonary arteriogram with possible catheter directed thrombolysis. Patient awake and alert sitting in bed. Complains of dyspnea, stable since admission. States he had a pain in his chest prior to presenting to ED this AM, since has subsided. Denies fever, chills, chest pain, abdominal pain, or headache.    Past Medical History:  Diagnosis Date  . ADD (attention deficit disorder) 11/27/2011  . Hypertension 06/03/2019  . Hypogonadism male 04/26/2013  . Obesity (BMI 30.0-34.9) 06/03/2019    Past Surgical History:  Procedure Laterality Date  . ACHILLES TENDON SURGERY Bilateral     Allergies: Patient has no known allergies.  Medications: Prior to Admission medications    Medication Sig Start Date End Date Taking? Authorizing Provider  hydrochlorothiazide (HYDRODIURIL) 25 MG tablet Take 25 mg by mouth daily. 01/28/19  Yes [provider]  TESTOSTERONE CYPIONATE IM Inject into the muscle every 28 (twenty-eight) days.   Yes [provider]     Family History  Problem Relation Age of Onset  . Diabetes Mother   . Deep vein thrombosis Brother   . Diabetes Maternal Grandmother   . Heart disease Maternal Grandmother   . Diabetes Maternal Grandfather   . Cancer Paternal Grandmother        pancreatic cancer  . Heart disease Paternal Grandfather     Social History   Socioeconomic History  . Marital status: Married    Spouse name: Not on file  . Number of children: Not on file  . Years of education: Not on file  . Highest education level: Not on file  Occupational History  . Not on file  Tobacco Use  . Smoking status: Never Smoker  . Smokeless tobacco: Never Used  Substance and Sexual Activity  . Alcohol use: Yes    Alcohol/week: 0.0 standard drinks  . Drug use: No  . Sexual activity: Yes  Other Topics Concern  . Not on file  Social History Narrative  . Not on file   Social Determinants of Health   Financial Resource Strain:   . Difficulty of Paying Living Expenses: Not on file  Food Insecurity:   . Worried About Charity fundraiser in the Last Year: Not on file  . Ran Out of Food in the Last Year: Not on file  Transportation Needs:   . Lack of Transportation (Medical): Not on file  . Lack of Transportation (Non-Medical): Not on file  Physical Activity:   . Days of Exercise per Week: Not on file  . Minutes of Exercise per Session: Not on file  Stress:   . Feeling of Stress : Not on file  Social Connections:   . Frequency of Communication with Friends and Family: Not on file  . Frequency of Social Gatherings with Friends and Family: Not on file  . Attends Religious Services: Not on file  . Active Member of Clubs or  Organizations: Not on file  . Attends Archivist Meetings: Not on file  . Marital Status: Not on file     Review of Systems: A 12 point ROS discussed and pertinent positives are indicated in the HPI above.  All other systems are negative.  Review of Systems  Constitutional: Negative for chills and fever.  Respiratory: Positive for shortness of breath. Negative for wheezing.   Cardiovascular: Negative for chest pain and palpitations.  Gastrointestinal: Negative for abdominal pain.  Neurological: Negative for headaches.  Psychiatric/Behavioral: Negative for behavioral problems and confusion.    Vital Signs: BP 112/85 (BP Location: Left Arm)   Pulse (!) 103   Temp 98 F (36.7 C) (Axillary)   Resp (!) 35   Ht 5\' 10"  (1.778 m)   Wt 227 lb 1.2 oz (103 kg)   SpO2 94%   BMI 32.58 kg/m   Physical Exam Vitals and nursing note reviewed.  Constitutional:      General: He is not in acute distress.    Appearance: Normal appearance.  Cardiovascular:     Rate and Rhythm: Regular rhythm. Tachycardia present.     Heart sounds: Normal heart sounds. No murmur.  Pulmonary:     Effort: No respiratory distress.     Breath sounds: Normal breath sounds.     Comments: Tachypnea.  Skin:    General: Skin is warm and dry.  Neurological:     Mental Status: He is alert and oriented to person, place, and time.  Psychiatric:        Mood and Affect: Mood normal.        Behavior: Behavior normal.      MD Evaluation Airway: WNL Heart: WNL Abdomen: WNL Chest/ Lungs: WNL ASA  Classification: 3 Mallampati/Airway Score: Two   Imaging: CT Angio Chest PE W and/or Wo Contrast  Result Date: 06/03/2019 CLINICAL DATA:  Shortness of breath EXAM: CT ANGIOGRAPHY CHEST WITH CONTRAST TECHNIQUE: Multidetector CT imaging of the chest was performed using the standard protocol during bolus administration of intravenous contrast. Multiplanar CT image reconstructions and MIPs were obtained to  evaluate the vascular anatomy. CONTRAST:  100mL OMNIPAQUE IOHEXOL 350 MG/ML SOLN COMPARISON:  Chest radiograph June 03, 2019 FINDINGS: Cardiovascular: There is extensive pulmonary embolus arising in both main pulmonary arteries, more severe on the right than on the left, with extension of pulmonary emboli into multiple upper and lower lobe pulmonary artery branches. The right ventricle to left ventricle diameter ratio is 2.3, consistent with right heart strain. There is no thoracic aortic aneurysm or dissection. The visualized great vessels appear normal. There is a small pericardial effusion. There is no pericardial thickening. Mediastinum/Nodes: Thyroid appears normal. There is no appreciable thoracic adenopathy. No esophageal lesions are evident. Lungs/Pleura: There is extensive airspace consolidation consistent with pneumonia in the left upper lobe with involvement of portions of the anterior and posterior segments of the left upper lobe. There is more patchy infiltrate in the medial aspect of the superior segment of the right lower lobe as well as in portions of the medial and posterior segments of the left lower lobe. No pleural effusions are evident. Upper Abdomen: There is reflux of contrast into the inferior vena cava and hepatic veins. Visualized upper abdominal structures otherwise appear unremarkable. Musculoskeletal: There is mild thoracic dextroscoliosis. There are no blastic or lytic bone lesions. No chest wall lesions are evident. Review of the MIP images confirms the above findings. IMPRESSION: 1. Extensive bilateral pulmonary embolus with pulmonary emboli extending from the main pulmonary outflow tracks throughout the upper and lower lobe pulmonary artery systems bilaterally. Positive for acute PE with CT evidence of right heart strain (RV/LV Ratio = 2.3) consistent with at least submassive (intermediate risk) PE. The presence of right heart strain has been associated with an increased risk of  morbidity and mortality. Please activate Code PE by paging [phone]. 2. Extensive airspace consolidation consistent with pneumonia in portions of the left upper and left lower lobes. A small amount of infiltrate is noted in the superior segment right lower lobe. A degree of intermingled pulmonary infarct cannot be excluded by CT. 3. Reflux of contrast into the inferior vena cava and hepatic veins, a finding indicative of increased right heart pressure. 4.  Small pericardial effusion. 5.  No thoracic aortic aneurysm or dissection. 6.  No appreciable adenopathy. Critical Value/emergent results were called by telephone at the time of interpretation on 06/03/2019 at 11:55 am to providerSCOTT GOLDSTON , who verbally acknowledged these results. Electronically Signed   By: Bretta BangWilliam  Woodruff III M.D.   On: 06/03/2019 11:55   DG Chest Portable 1 View  Result Date: 06/03/2019 CLINICAL DATA:  Cough and shortness of breath EXAM: PORTABLE CHEST 1 VIEW COMPARISON:  None. FINDINGS: There is an area of ill-defined increased opacity in the left upper lobe. Lungs elsewhere are clear.  Heart is borderline enlarged with pulmonary vascularity normal. No adenopathy. No bone lesions. IMPRESSION: Ill-defined area of opacity in the left upper lobe, suspicious for early pneumonia. Lungs elsewhere clear. Borderline cardiac enlargement. Electronically Signed   By: Bretta Bang III M.D.   On: 06/03/2019 10:12   ECHOCARDIOGRAM COMPLETE  Result Date: 06/03/2019   ECHOCARDIOGRAM REPORT   Patient Name:   Ricky Bentley Date of Exam: 06/03/2019 Medical Rec #:  [id number]        Height:       70.0 in Accession #:    [id number]       Weight:       230.0 lb Date of Birth:  [birth year]        BSA:          2.22 m Patient Age:    53 years         BP:           156/113 mmHg Patient Gender: M                HR:           103 bpm. Exam Location:  Inpatient Procedure: 2D Echo, Cardiac Doppler and Color Doppler Indications:    Pulmonary  embolus 415.19  History:        Patient has no prior history of Echocardiogram examinations.                 Risk Factors:Non-Smoker. Pulmonary embolus.  Sonographer:    Tonia Ghent RDCS Referring Phys: [id number] DAVID MANUEL ORTIZ IMPRESSIONS  1. Left ventricular ejection fraction, by visual estimation, is 55 to 60%. The left ventricle has hyperdynamic function. There is mildly increased left ventricular hypertrophy.  2. Left ventricular diastolic parameters are consistent with Grade I diastolic dysfunction (impaired relaxation).  3. Right ventricular volume and pressure overload.  4. The left ventricle demonstrates regional wall motion abnormalities.  5. Global right ventricle has moderately reduced systolic function.The right ventricular size is moderately enlarged. No increase in right ventricular wall thickness.  6. Left atrial size was normal.  7. Right atrial size was normal.  8. Small pericardial effusion.  9. The pericardial effusion is circumferential. 10. The mitral valve is grossly normal. Trivial mitral valve regurgitation. 11. The tricuspid valve is grossly normal. Tricuspid valve regurgitation is trivial. 12. The aortic valve is tricuspid. Aortic valve regurgitation is not visualized. 13. The pulmonic valve was grossly normal. Pulmonic valve regurgitation is trivial. 14. Severely elevated pulmonary artery systolic pressure. 15. The inferior vena cava is dilated in size with <50% respiratory variability, suggesting right atrial pressure of 15 mmHg. 16. Findings consistent with RV strain FINDINGS  Left Ventricle: Left ventricular ejection fraction, by visual estimation, is 55 to 60%. The left ventricle has hyperdynamic function. The left ventricle demonstrates regional wall motion abnormalities. There is mildly increased left ventricular hypertrophy. The interventricular septum is flattened in systole and diastole, consistent with right ventricular pressure and volume overload. Left ventricular  diastolic parameters are consistent with Grade I diastolic dysfunction (impaired relaxation). Normal left atrial pressure. Right Ventricle: The right ventricular size is moderately enlarged. No increase in right ventricular wall thickness. Global RV systolic function is has moderately reduced systolic function. The tricuspid regurgitant velocity is 3.95 m/s, and with an assumed right atrial pressure of 15 mmHg, the estimated right ventricular systolic pressure is severely elevated at 77.4 mmHg. Left Atrium: Left atrial size was normal in size. Right Atrium: Right atrial size was normal  in size Pericardium: A small pericardial effusion is present. The pericardial effusion is circumferential. Mitral Valve: The mitral valve is grossly normal. Trivial mitral valve regurgitation. Tricuspid Valve: The tricuspid valve is grossly normal. Tricuspid valve regurgitation is trivial. Aortic Valve: The aortic valve is tricuspid. Aortic valve regurgitation is not visualized. Pulmonic Valve: The pulmonic valve was grossly normal. Pulmonic valve regurgitation is trivial. Pulmonic regurgitation is trivial. Aorta: The aortic root and ascending aorta are structurally normal, with no evidence of dilitation. Venous: The inferior vena cava is dilated in size with less than 50% respiratory variability, suggesting right atrial pressure of 15 mmHg. IAS/Shunts: No atrial level shunt detected by color flow Doppler.  LEFT VENTRICLE PLAX 2D LVIDd:         3.90 cm       Diastology LVIDs:         2.50 cm       LV e' lateral:   11.50 cm/s LV PW:         1.00 cm       LV E/e' lateral: 3.2 LV IVS:        1.00 cm       LV e' medial:    7.07 cm/s LVOT diam:     2.10 cm       LV E/e' medial:  5.3 LV SV:         44 ml LV SV Index:   18.92 LVOT Area:     3.46 cm  LV Volumes (MOD) LV area d, A2C:    27.20 cm LV area d, A4C:    28.90 cm LV area s, A2C:    16.50 cm LV area s, A4C:    19.60 cm LV major d, A2C:   8.14 cm LV major d, A4C:   7.80 cm LV  major s, A2C:   6.89 cm LV major s, A4C:   6.42 cm LV vol d, MOD A2C: 77.7 ml LV vol d, MOD A4C: 95.9 ml LV vol s, MOD A2C: 35.0 ml LV vol s, MOD A4C: 50.7 ml LV SV MOD A2C:     42.7 ml LV SV MOD A4C:     95.9 ml LV SV MOD BP:      44.8 ml RIGHT VENTRICLE RV S prime:     9.14 cm/s TAPSE (M-mode): 1.6 cm LEFT ATRIUM             Index       RIGHT ATRIUM           Index LA diam:        2.90 cm 1.31 cm/m  RA Area:     21.50 cm LA Vol (A2C):   25.3 ml 11.42 ml/m RA Volume:   67.80 ml  30.61 ml/m LA Vol (A4C):   29.1 ml 13.14 ml/m LA Biplane Vol: 28.1 ml 12.69 ml/m  AORTIC VALVE LVOT Vmax:   69.60 cm/s LVOT Vmean:  50.600 cm/s LVOT VTI:    0.112 m  AORTA Ao Root diam: 3.20 cm MITRAL VALVE                        TRICUSPID VALVE MV Area (PHT): 2.91 cm             TR Peak grad:   62.4 mmHg MV PHT:        75.69 msec           TR Vmax:  395.00 cm/s MV Decel Time: 261 msec MV E velocity: 37.20 cm/s 103 cm/s  SHUNTS MV A velocity: 44.50 cm/s 70.3 cm/s Systemic VTI:  0.11 m MV E/A ratio:  0.84       1.5       Systemic Diam: 2.10 cm  Zoila Shutter MD Electronically signed by Zoila Shutter MD Signature Date/Time: 06/03/2019/2:36:09 PM    Final     Labs:  CBC: Recent Labs    06/03/19 1028  WBC 9.7  HGB 16.6  HCT 49.5  PLT 291    COAGS: Recent Labs    06/03/19 1215  INR 1.0  APTT 37*    BMP: Recent Labs    06/03/19 1028  NA 137  K 4.0  CL 103  CO2 22  GLUCOSE 116*  BUN 25*  CALCIUM 9.1  CREATININE 1.69*  GFRNONAA 46*  GFRAA 53*     Assessment and Plan:  Bilateral PEs with evidence of right heart strain on echo along with elevated troponins. Plan for image-guided pulmonary arteriogram with possible catheter directed thrombolysis tentatively for today in IR. Patient is NPO. Afebrile and WBCs WNL. Ok to proceed with continuous heparin per IR protocol. INR 1.0 today.  Risks and benefits discussed with the patient including, but not limited to bleeding, possible life  threatening bleeding and need for blood product transfusion, vascular injury, stroke, contrast induced renal failure, limb loss and infection. All of the patient's questions were answered, patient is agreeable to proceed. Also spoke with patient's wife, Alwyn Cordner, via telephone. Verbal consent obtained due to pending covid status- in IR control room.   Thank you for this interesting consult.  I greatly enjoyed meeting Kolston F Runquist and look forward to participating in their care.  A copy of this report was sent to the requesting provider on this date.  Electronically Signed: Elwin Mocha, PA-C 06/03/2019, 5:19 PM   I spent a total of 40 Minutes in face to face in clinical consultation, greater than 50% of which was counseling/coordinating care for bilateral PEs/thrombolysis.

## 2019-06-03 NOTE — Progress Notes (Signed)
ANTICOAGULATION CONSULT NOTE - Initial Consult  Pharmacy Consult for heparin Indication: pulmonary embolus  No Known Allergies  Patient Measurements: Height: 5\' 10"  (177.8 cm) Weight: 227 lb 1.2 oz (103 kg) IBW/kg (Calculated) : 73 Heparin Dosing Weight: 95 kg  Vital Signs: Temp: 98 F (36.7 C) (12/17 1600) Temp Source: Axillary (12/17 1600) BP: 111/87 (12/17 1900) Pulse Rate: 111 (12/17 1900)  Labs: Recent Labs    06/03/19 1028 06/03/19 1215 06/03/19 1722  HGB 16.6  --   --   HCT 49.5  --   --   PLT 291  --   --   APTT  --  37*  --   LABPROT  --  13.3  --   INR  --  1.0  --   HEPARINUNFRC  --   --  0.17*  CREATININE 1.69*  --   --   TROPONINIHS 72* 80*  --     Estimated Creatinine Clearance: 62.2 mL/min (A) (by C-G formula based on SCr of 1.69 mg/dL (H)).   Medical History: Past Medical History:  Diagnosis Date  . ADD (attention deficit disorder) 11/27/2011  . Hypertension 06/03/2019  . Hypogonadism male 04/26/2013  . Obesity (BMI 30.0-34.9) 06/03/2019    Medications:  Scheduled:  . fentaNYL      . lidocaine      . midazolam      . sodium chloride (PF)       Infusions:  . [START ON 06/04/2019] sodium chloride    . [START ON 06/04/2019] sodium chloride    . sodium chloride 10 mL/hr (06/03/19 1834)  . alteplase (tPA/ ACTIVASE) PE Lysis 12 mg/250 mL (BILATERAL)    . alteplase (tPA/ ACTIVASE) PE Lysis 12 mg/250 mL (BILATERAL) 12 mg (06/03/19 1909)  . [START ON 06/04/2019] azithromycin    . [START ON 06/04/2019] cefTRIAXone (ROCEPHIN)  IV    . heparin 1,600 Units/hr (06/03/19 1800)  . azithromycin (ZITHROMAX) 500 MG IVPB (Vial-Mate [employer])      Assessment: Pharmacy consulted to dose/monitor heparin in this 17 yoM presenting with cough, SOB. CT angio chest revealed extensive bilateral submassive PE with evidence of right heart strain. Pt not taking anticoagulants PTA.   Baseline labs: -Hgb 16.6 -Plt 291 -INR 1 -aPTT 37  Today, 06/03/19  HL =  0.17 is subtherapeutic on heparin infusion of 1600 units/hr   Patient currently undergoing catheter-directed thrombolysis by IR  No documented incidences of bleeding/bruising.   Pharmacy discussed anticoagulation/thrombolytic plan with IR. Heparin infusion will be continued while alteplase infusing. Ok to increase heparin infusion at this time to target therapeutic level.  Goal of Therapy:  Heparin level 0.3-0.7 units/ml Monitor platelets by anticoagulation protocol: Yes   Plan:   No bolus per protocol for EKOS while alteplase is infusing  Increase heparin infusion to 1900 units/hr  Check HL in 6 hours. Continue q6h HL checks for 12-24 hours post-alteplase infusion  CBC daily  Monitor for signs/symptoms of bleeding  Follow along for transition to PO anticoagulation  Lenis Noon, PharmD 06/03/2019,7:14 PM

## 2019-06-03 NOTE — ED Provider Notes (Signed)
[facility]-EMERGENCY DEPT Provider Note   CSN: [id number] Arrival date & time: 06/03/19  3875     History Chief Complaint  Patient presents with  . Shortness of Breath  . Cough    MACLANE HOLLORAN is a 53 y.o. male.  HPI 53 year old male presents with shortness of breath.  He has been feeling short of breath for about 10 days.  It is a continuous feeling.  Whenever he exerts himself such as walking upstairs it is worse.  No significant orthopnea.  No leg swelling or unilateral calf swelling.  He did have a little bit of right-sided chest pressure yesterday but it was mild and gone.  Yesterday he developed a cough for the first time.  He did have a negative Covid test early on in his course.  The cough does seem to be a little worse with lying flat.  He had a low-grade fever of about 100 a few days ago but none since.  History reviewed. No pertinent past medical history.  Patient Active Problem List   Diagnosis Date Noted  . Bilateral pulmonary embolism (HCC) 06/03/2019  . Hypogonadism male 04/26/2013  . Low testosterone 02/19/2013  . Erectile dysfunction 02/19/2013  . BMI 34.0-34.9,adult 11/27/2011  . Insomnia 11/27/2011  . ADD (attention deficit disorder) 11/27/2011    Past Surgical History:  Procedure Laterality Date  . ACHILLES TENDON SURGERY         Family History  Problem Relation Age of Onset  . Diabetes Mother   . Diabetes Maternal Grandmother   . Heart disease Maternal Grandmother   . Diabetes Maternal Grandfather   . Cancer Paternal Grandmother        pancreatic cancer  . Heart disease Paternal Grandfather     Social History   Tobacco Use  . Smoking status: Never Smoker  . Smokeless tobacco: Never Used  Substance Use Topics  . Alcohol use: Yes    Alcohol/week: 0.0 standard drinks  . Drug use: No    Home Medications Prior to Admission medications   Medication Sig Start Date End Date Taking? Authorizing Provider    hydrochlorothiazide (HYDRODIURIL) 25 MG tablet Take 25 mg by mouth daily. 01/28/19  Yes [provider]  TESTOSTERONE CYPIONATE IM Inject into the muscle every 28 (twenty-eight) days.   Yes [provider]    Allergies    Patient has no known allergies.  Review of Systems   Review of Systems  Constitutional: Positive for fever.  HENT: Negative for sore throat.   Respiratory: Positive for cough and shortness of breath.   Cardiovascular: Positive for chest pain. Negative for leg swelling.  Gastrointestinal: Negative for abdominal pain, diarrhea and vomiting.  All other systems reviewed and are negative.   Physical Exam Updated Vital Signs BP (!) 156/113 (BP Location: Left Arm)   Pulse (!) 108   Temp (!) 97.3 F (36.3 C) (Oral)   Resp 20   Ht 5\' 10"  (1.778 m)   Wt 104.3 kg   SpO2 94%   BMI 33.00 kg/m   Physical Exam Vitals and nursing note reviewed.  Constitutional:      Appearance: He is well-developed.  HENT:     Head: Normocephalic and atraumatic.     Right Ear: External ear normal.     Left Ear: External ear normal.     Nose: Nose normal.  Eyes:     General:        Right eye: No discharge.  Left eye: No discharge.  Cardiovascular:     Rate and Rhythm: Regular rhythm. Tachycardia present.     Heart sounds: Normal heart sounds.     Comments: HR low 100s Pulmonary:     Effort: Pulmonary effort is normal. No accessory muscle usage or respiratory distress.     Breath sounds: Normal breath sounds. No wheezing.  Abdominal:     Palpations: Abdomen is soft.     Tenderness: There is no abdominal tenderness.  Musculoskeletal:     Cervical back: Neck supple.     Right lower leg: No tenderness. No edema.     Left lower leg: No tenderness. No edema.  Skin:    General: Skin is warm and dry.  Neurological:     Mental Status: He is alert.  Psychiatric:        Mood and Affect: Mood is not anxious.     ED Results / Procedures / Treatments    Labs (all labs ordered are listed, but only abnormal results are displayed) Labs Reviewed  BASIC METABOLIC PANEL - Abnormal; Notable for the following components:      Result Value   Glucose, Bld 116 (*)    BUN 25 (*)    Creatinine, Ser 1.69 (*)    GFR calc non Af Amer 46 (*)    GFR calc Af Amer 53 (*)    All other components within normal limits  BRAIN NATRIURETIC PEPTIDE - Abnormal; Notable for the following components:   B Natriuretic Peptide 589.8 (*)    All other components within normal limits  TROPONIN I (HIGH SENSITIVITY) - Abnormal; Notable for the following components:   Troponin I (High Sensitivity) 72 (*)    All other components within normal limits  SARS CORONAVIRUS 2 (TAT 6-24 HRS)  CULTURE, BLOOD (ROUTINE X 2)  CULTURE, BLOOD (ROUTINE X 2)  CBC WITH DIFFERENTIAL/PLATELET  PROTIME-INR  APTT  HEPARIN LEVEL (UNFRACTIONATED)  POC SARS CORONAVIRUS 2 AG -  ED  TROPONIN I (HIGH SENSITIVITY)    EKG EKG Interpretation  Date/Time:  Thursday June 03 2019 09:39:18 EST Ventricular Rate:  107 PR Interval:    QRS Duration: 96 QT Interval:  364 QTC Calculation: 486 R Axis:   83 Text Interpretation: Sinus tachycardia Nonspecific T abnormalities, anterior leads Borderline prolonged QT interval Baseline wander in lead(s) V4 No old tracing to compare Confirmed by Sherwood Gambler [phone]) on 06/03/2019 9:46:10 AM   Radiology CT Angio Chest PE W and/or Wo Contrast  Result Date: 06/03/2019 CLINICAL DATA:  Shortness of breath EXAM: CT ANGIOGRAPHY CHEST WITH CONTRAST TECHNIQUE: Multidetector CT imaging of the chest was performed using the standard protocol during bolus administration of intravenous contrast. Multiplanar CT image reconstructions and MIPs were obtained to evaluate the vascular anatomy. CONTRAST:  164mL OMNIPAQUE IOHEXOL 350 MG/ML SOLN COMPARISON:  Chest radiograph June 03, 2019 FINDINGS: Cardiovascular: There is extensive pulmonary embolus arising in both  main pulmonary arteries, more severe on the right than on the left, with extension of pulmonary emboli into multiple upper and lower lobe pulmonary artery branches. The right ventricle to left ventricle diameter ratio is 2.3, consistent with right heart strain. There is no thoracic aortic aneurysm or dissection. The visualized great vessels appear normal. There is a small pericardial effusion. There is no pericardial thickening. Mediastinum/Nodes: Thyroid appears normal. There is no appreciable thoracic adenopathy. No esophageal lesions are evident. Lungs/Pleura: There is extensive airspace consolidation consistent with pneumonia in the left upper lobe with involvement of portions  of the anterior and posterior segments of the left upper lobe. There is more patchy infiltrate in the medial aspect of the superior segment of the right lower lobe as well as in portions of the medial and posterior segments of the left lower lobe. No pleural effusions are evident. Upper Abdomen: There is reflux of contrast into the inferior vena cava and hepatic veins. Visualized upper abdominal structures otherwise appear unremarkable. Musculoskeletal: There is mild thoracic dextroscoliosis. There are no blastic or lytic bone lesions. No chest wall lesions are evident. Review of the MIP images confirms the above findings. IMPRESSION: 1. Extensive bilateral pulmonary embolus with pulmonary emboli extending from the main pulmonary outflow tracks throughout the upper and lower lobe pulmonary artery systems bilaterally. Positive for acute PE with CT evidence of right heart strain (RV/LV Ratio = 2.3) consistent with at least submassive (intermediate risk) PE. The presence of right heart strain has been associated with an increased risk of morbidity and mortality. Please activate Code PE by paging [phone]. 2. Extensive airspace consolidation consistent with pneumonia in portions of the left upper and left lower lobes. A small amount of  infiltrate is noted in the superior segment right lower lobe. A degree of intermingled pulmonary infarct cannot be excluded by CT. 3. Reflux of contrast into the inferior vena cava and hepatic veins, a finding indicative of increased right heart pressure. 4.  Small pericardial effusion. 5.  No thoracic aortic aneurysm or dissection. 6.  No appreciable adenopathy. Critical Value/emergent results were called by telephone at the time of interpretation on 06/03/2019 at 11:55 am to providerSCOTT Albert Devaul , who verbally acknowledged these results. Electronically Signed   By: Bretta BangWilliam  Woodruff III M.D.   On: 06/03/2019 11:55   DG Chest Portable 1 View  Result Date: 06/03/2019 CLINICAL DATA:  Cough and shortness of breath EXAM: PORTABLE CHEST 1 VIEW COMPARISON:  None. FINDINGS: There is an area of ill-defined increased opacity in the left upper lobe. Lungs elsewhere are clear. Heart is borderline enlarged with pulmonary vascularity normal. No adenopathy. No bone lesions. IMPRESSION: Ill-defined area of opacity in the left upper lobe, suspicious for early pneumonia. Lungs elsewhere clear. Borderline cardiac enlargement. Electronically Signed   By: Bretta BangWilliam  Woodruff III M.D.   On: 06/03/2019 10:12    Procedures .Critical Care Performed by: Pricilla LovelessGoldston, Elior Robinette, MD Authorized by: Pricilla LovelessGoldston, Darnesha Diloreto, MD   Critical care provider statement:    Critical care time (minutes):  40   Critical care time was exclusive of:  Separately billable procedures and treating other patients   Critical care was necessary to treat or prevent imminent or life-threatening deterioration of the following conditions:  Circulatory failure, cardiac failure and respiratory failure   Critical care was time spent personally by me on the following activities:  Discussions with consultants, evaluation of patient's response to treatment, examination of patient, ordering and performing treatments and interventions, ordering and review of laboratory  studies, ordering and review of radiographic studies, pulse oximetry, re-evaluation of patient's condition, obtaining history from patient or surrogate and review of old charts   (including critical care time)  Medications Ordered in ED Medications  sodium chloride (PF) 0.9 % injection (has no administration in time range)  cefTRIAXone (ROCEPHIN) 1 g in sodium chloride 0.9 % 100 mL IVPB (1 g Intravenous New Bag/Given 06/03/19 1235)  azithromycin (ZITHROMAX) 500 mg in sodium chloride 0.9 % 250 mL IVPB (has no administration in time range)  heparin ADULT infusion 100 units/mL (25000 units/2850mL sodium chloride 0.45%) (  1,600 Units/hr Intravenous New Bag/Given 06/03/19 1239)  sodium chloride 0.9 % bolus 500 mL (0 mLs Intravenous Stopped 06/03/19 1232)  iohexol (OMNIPAQUE) 350 MG/ML injection 100 mL (100 mLs Intravenous Contrast Given 06/03/19 1128)  heparin bolus via infusion 4,000 Units (4,000 Units Intravenous Bolus from Bag 06/03/19 1238)    ED Course  I have reviewed the triage vital signs and the nursing notes.  Pertinent labs & imaging results that were available during my care of the patient were reviewed by me and considered in my medical decision making (see chart for details).    MDM Rules/Calculators/A&P                      High concern for PE initially which led directly to a CT scan after initial work-up.  This confirms pulmonary emboli.  He has right heart strain and other markers concerning for significant PE including elevated troponin and BNP.  His exam is pretty unremarkable and he overall clinically looks well.  He appears to have had this PE for over a week.  CT is more concerning for pneumonia rather than infarct and with his cough starting last night I think is reasonable to start him on antibiotics.  Dr. Robb Matar will admit.  I discussed with intensivist, Dr. Delton Coombes, they will consult in regard to possible other treatments for PE.  Will place on heparin.  LAWERENCE DERY  was evaluated in Emergency Department on 06/03/2019 for the symptoms described in the history of present illness. He was evaluated in the context of the global COVID-19 pandemic, which necessitated consideration that the patient might be at risk for infection with the SARS-CoV-2 virus that causes COVID-19. Institutional protocols and algorithms that pertain to the evaluation of patients at risk for COVID-19 are in a state of rapid change based on information released by regulatory bodies including the CDC and federal and state organizations. These policies and algorithms were followed during the patient's care in the ED.  Final Clinical Impression(s) / ED Diagnoses Final diagnoses:  Bilateral pulmonary embolism [facility])    Rx / DC Orders ED Discharge Orders    None       Pricilla Loveless, MD 06/03/19 1242

## 2019-06-03 NOTE — Progress Notes (Signed)
ANTICOAGULATION CONSULT NOTE - Initial Consult  Pharmacy Consult for heparin Indication: pulmonary embolus  No Known Allergies  Patient Measurements: Height: 5\' 10"  (177.8 cm) Weight: 230 lb (104.3 kg) IBW/kg (Calculated) : 73 Heparin Dosing Weight: 95kg  Vital Signs: Temp: 97.3 F (36.3 C) (12/17 0932) Temp Source: Oral (12/17 0932) BP: 156/113 (12/17 0932) Pulse Rate: 108 (12/17 0932)  Labs: Recent Labs    06/03/19 1028  HGB 16.6  HCT 49.5  PLT 291  CREATININE 1.69*  TROPONINIHS 72*    Estimated Creatinine Clearance: 62.5 mL/min (A) (by C-G formula based on SCr of 1.69 mg/dL (H)).   Medical History: History reviewed. No pertinent past medical history.   Assessment: 53 yo male with new PE, no prior anticoagulation.  Pharmacy to dose heparin drip.   06/03/2019 CBC WNL Stat aPTT and pt/INR ordered  Goal of Therapy:  Heparin level 0.3-0.7 units/ml Monitor platelets by anticoagulation protocol:   Plan:  Heparin bolus 4000 units x 1 Start heparin drip at 1600 units/hr Heparin level in 6 hours Daily CBC  Dolly Rias RPh 06/03/2019, 12:08 PM

## 2019-06-03 NOTE — Consult Note (Signed)
NAME:  Ricky Bentley, MRN:  [id number], DOB:  11-Feb-1966, LOS: 0 ADMISSION DATE:  06/03/2019, CONSULTATION DATE:  12/17 REFERRING MD:  Robb Matar, CHIEF COMPLAINT: submassive PE  Brief History   Mr. Ricky Bentley is a 53 year old gentleman who presents with submassive PE.  He has had shortness of breath began suddenly 2 days ago, and last night developed chills, coughing, orthopnea, night sweats.  Elevated BNP, troponin, RV strain on echocardiogram.  History of present illness   Mr. Ricky Bentley is a 53 y/o gentleman who presented to the ED yesterday with dyspnea on exertion and palpitations that began suddenly 8 days ago.  He began having a mildly productive cough yesterday.  He denies chest pain, hemoptysis, recent leg edema, lightheadedness or dizziness.  He presented to the ED when his saturations dropped into the 80s at home, the lowest being 86%.  He has no previous history of lung disease or DVT.  He has never smoked.  He exercises doing interval training, and was feeling fine before the sudden onset of his symptoms.  He has a brother who has a DVT. There is no family history of miscarriages.  He denies recent prolonged travel, prolonged immobility, surgery.  He denies unexplained bleeding, history of stroke/intracranial bleeding, coagulopathy.  He has lost 30 pounds over the last few months with dietary changes and starting exercising. He denies other new symptoms. He receives testosterone injections at the beginning of every month from his PCP for chronic hypogonadism.  He has no personal history of cancer.  He is a never smoker.  He works as a Customer service manager, and has been diligent with wearing a mask, but has been working outside of the home recently.  He has no known Covid contacts.  In the ED he had a CTA demonstrating bilateral PE with RV dilation.  Echocardiogram demonstrates RV strain.  Currently he denies shortness of breath and chest pain.  He is not currently using oxygen and is saturating 95%.  He is not short of breath without it.  Past Medical History  Obesity HTN ADD  Significant Hospital Events     Consults:  PCCM  Procedures:    Significant Diagnostic Tests:  12/17 echo- LVEF  55-60%, grade 1 diastolic dysfunction. RV volume and pressure overload with moderately reduced RV systolic function. Trivial TR, TAPSE 13mm. Severely elevated PASP.  12/17 CTA chest-peripheral opacities in the bronchopulmonary distribution of the right upper and right lower lobe.  Otherwise no significant interstitial abnormalities.  Bilateral PEs in left and right mainstem and segmental arteries.  No significant PE.  Dilated right atrium with contrast refluxing down the IVC into liver.  12/17 EKG- Sinus tachycardia, normal axis.  S1T3 w/o Q-waves in III.  T wave inversion in septal precordial leads.  Appropriate R wave progression across precordial's.  QTc prolongation.  Micro Data:  12/17 SARS-CoV-2 pending  Antimicrobials:  Ceftriaxone 12/17 Azithromycin 12/17  Interim history/subjective:    Objective   Blood pressure 112/85, pulse (!) 103, temperature (!) 97.3 F (36.3 C), temperature source Oral, resp. rate (!) 35, height 5\' 10"  (1.778 m), weight 104.3 kg, SpO2 94 %.        Intake/Output Summary (Last 24 hours) at 06/03/2019 1653 Last data filed at 06/03/2019 1600 Gross per 24 hour  Intake 941.62 ml  Output --  Net 941.62 ml   Filed Weights   06/03/19 0932  Weight: 104.3 kg    Examination: General: Healthy-appearing man sitting up in bed in no acute distress  HENT: [place]/AT, eyes anicteric Lungs: Mild tachypnea, breathing comfortably on room air, no conversational dyspnea.  CTAB. Cardiovascular: Mild tachycardia, regular rhythm, no murmurs Abdomen: Soft, nontender, nondistended Extremities: No peripheral edema, clubbing, or cyanosis Neuro: Awake and alert, answering questions appropriately, moving all extremities spontaneously.  Resolved Hospital Problem list      Assessment & Plan:   Acute hypoxic respiratory failure due to bilateral submassive PE.  I suspect that his peripheral infiltrates may be infarcted lung. -Echocardiogram reviewed.  Based on these results I recommend consultation to IR for consideration for EKOS. -Anticoagulation-prefer Lovenox to heparin. -Supplemental oxygen as required to maintain sats greater than 90% -Needs age-appropriate cancer screening as an outpatient. -He is a candidate thrombolytic therapy should he progress to obstructive shock.  We discussed the risks and benefits of thrombolytic use with massive PE, and he will discuss with his wife to determine if he would be interested in receiving this therapy should he progress. -Covid test pending; if negative this is an unprovoked PE, and he likely would benefit from lifelong anticoagulation with a DOAC.  Best practice:  Diet: N.p.o. Pain/Anxiety/Delirium protocol (if indicated): Per primary VAP protocol (if indicated): n/a DVT prophylaxis: Heparin infusion GI prophylaxis: Not required Mobility: Bedrest Code Status: Full Family Communication: Patient to update his family Disposition: ICU  Labs   CBC: Recent Labs  Lab 06/03/19 1028  WBC 9.7  NEUTROABS 6.9  HGB 16.6  HCT 49.5  MCV 89.7  PLT 291    Basic Metabolic Panel: Recent Labs  Lab 06/03/19 1028 06/03/19 1215  NA 137  --   K 4.0  --   CL 103  --   CO2 22  --   GLUCOSE 116*  --   BUN 25*  --   CREATININE 1.69*  --   CALCIUM 9.1  --   MG  --  2.1   GFR: Estimated Creatinine Clearance: 62.5 mL/min (A) (by C-G formula based on SCr of 1.69 mg/dL (H)). Recent Labs  Lab 06/03/19 1028  WBC 9.7    Liver Function Tests: No results for input(s): AST, ALT, ALKPHOS, BILITOT, PROT, ALBUMIN in the last 168 hours. No results for input(s): LIPASE, AMYLASE in the last 168 hours. No results for input(s): AMMONIA in the last 168 hours.  ABG No results found for: PHART, PCO2ART, PO2ART, HCO3,  TCO2, ACIDBASEDEF, O2SAT   Coagulation Profile: Recent Labs  Lab 06/03/19 1215  INR 1.0    Cardiac Enzymes: No results for input(s): CKTOTAL, CKMB, CKMBINDEX, TROPONINI in the last 168 hours.   High- Sensitivity troponin 72--> 80 BNP 589.8   HbA1C: No results found for: HGBA1C  CBG: No results for input(s): GLUCAP in the last 168 hours.  Review of Systems:   ROS positive for cough, mild sputum production, dyspnea on exertion, palpitations on exertion, otherwise negative review of systems.  Past Medical History  He,  has a past medical history of ADD (attention deficit disorder) (11/27/2011), Hypertension (06/03/2019), Hypogonadism male (04/26/2013), and Obesity (BMI 30.0-34.9) (06/03/2019).   Surgical History    Past Surgical History:  Procedure Laterality Date  . ACHILLES TENDON SURGERY Bilateral      Social History   reports that he has never smoked. He has never used smokeless tobacco. He reports current alcohol use. He reports that he does not use drugs.   Family History   His family history includes Cancer in his paternal grandmother; Deep vein thrombosis in his brother; Diabetes in his maternal grandfather, maternal grandmother, and  mother; Heart disease in his maternal grandmother and paternal grandfather.   Allergies No Known Allergies   Home Medications  Prior to Admission medications   Medication Sig Start Date End Date Taking? Authorizing Provider  hydrochlorothiazide (HYDRODIURIL) 25 MG tablet Take 25 mg by mouth daily. 01/28/19  Yes [provider]  TESTOSTERONE CYPIONATE IM Inject into the muscle every 28 (twenty-eight) days.   Yes [provider]    This patient is critically ill with multiple organ system failure which requires frequent high complexity decision making, assessment, support, evaluation, and titration of therapies. This was completed through the application of advanced monitoring technologies and extensive interpretation  of multiple databases. During this encounter critical care time was devoted to patient care services described in this note for 50 minutes.    Julian Hy, DO 06/03/19 5:10 PM [facility]

## 2019-06-04 ENCOUNTER — Inpatient Hospital Stay (HOSPITAL_COMMUNITY): Payer: 59

## 2019-06-04 DIAGNOSIS — I50811 Acute right heart failure: Secondary | ICD-10-CM

## 2019-06-04 DIAGNOSIS — I2699 Other pulmonary embolism without acute cor pulmonale: Secondary | ICD-10-CM

## 2019-06-04 HISTORY — PX: IR THROMB F/U EVAL ART/VEN FINAL DAY (MS): IMG5379

## 2019-06-04 LAB — CBC
HCT: 45.4 % (ref 39.0–52.0)
HCT: 44 % (ref 39.0–52.0)
HCT: 45.1 % (ref 39.0–52.0)
Hemoglobin: 15 g/dL (ref 13.0–17.0)
Hemoglobin: 14.5 g/dL (ref 13.0–17.0)
Hemoglobin: 15 g/dL (ref 13.0–17.0)
MCH: 29.6 pg (ref 26.0–34.0)
MCH: 29.5 pg (ref 26.0–34.0)
MCH: 30 pg (ref 26.0–34.0)
MCHC: 33 g/dL (ref 30.0–36.0)
MCHC: 33 g/dL (ref 30.0–36.0)
MCHC: 33.3 g/dL (ref 30.0–36.0)
MCV: 89.5 fL (ref 80.0–100.0)
MCV: 89.6 fL (ref 80.0–100.0)
MCV: 90.2 fL (ref 80.0–100.0)
Platelets: 228 10*3/uL (ref 150–400)
Platelets: 199 10*3/uL (ref 150–400)
Platelets: 213 10*3/uL (ref 150–400)
RBC: 5.07 MIL/uL (ref 4.22–5.81)
RBC: 4.91 MIL/uL (ref 4.22–5.81)
RBC: 5 MIL/uL (ref 4.22–5.81)
RDW: 13.3 % (ref 11.5–15.5)
RDW: 13.3 % (ref 11.5–15.5)
RDW: 13.2 % (ref 11.5–15.5)
WBC: 10.3 10*3/uL (ref 4.0–10.5)
WBC: 8.6 10*3/uL (ref 4.0–10.5)
WBC: 8.5 10*3/uL (ref 4.0–10.5)
nRBC: 0 % (ref 0.0–0.2)
nRBC: 0 % (ref 0.0–0.2)
nRBC: 0 % (ref 0.0–0.2)

## 2019-06-04 LAB — HIV ANTIBODY (ROUTINE TESTING W REFLEX): HIV Screen 4th Generation wRfx: NONREACTIVE

## 2019-06-04 LAB — COMPREHENSIVE METABOLIC PANEL
ALT: 43 U/L (ref 0–44)
AST: 22 U/L (ref 15–41)
Albumin: 3.4 g/dL — ABNORMAL LOW (ref 3.5–5.0)
Alkaline Phosphatase: 49 U/L (ref 38–126)
Anion gap: 12 (ref 5–15)
BUN: 20 mg/dL (ref 6–20)
CO2: 21 mmol/L — ABNORMAL LOW (ref 22–32)
Calcium: 8.4 mg/dL — ABNORMAL LOW (ref 8.9–10.3)
Chloride: 105 mmol/L (ref 98–111)
Creatinine, Ser: 1.45 mg/dL — ABNORMAL HIGH (ref 0.61–1.24)
GFR calc Af Amer: >60 mL/min (ref >60)
GFR calc non Af Amer: 55 mL/min — ABNORMAL LOW (ref >60)
Glucose, Bld: 117 mg/dL — ABNORMAL HIGH (ref 70–99)
Potassium: 3.8 mmol/L (ref 3.5–5.1)
Sodium: 138 mmol/L (ref 135–145)
Total Bilirubin: 1.2 mg/dL (ref 0.3–1.2)
Total Protein: 6.4 g/dL — ABNORMAL LOW (ref 6.5–8.1)

## 2019-06-04 LAB — FIBRINOGEN
Fibrinogen: 474 mg/dL (ref 210–475)
Fibrinogen: 500 mg/dL — ABNORMAL HIGH (ref 210–475)
Fibrinogen: 487 mg/dL — ABNORMAL HIGH (ref 210–475)

## 2019-06-04 LAB — HEPARIN LEVEL (UNFRACTIONATED)
Heparin Unfractionated: 0.23 IU/mL — ABNORMAL LOW (ref 0.30–0.70)
Heparin Unfractionated: 0.29 IU/mL — ABNORMAL LOW (ref 0.30–0.70)
Heparin Unfractionated: 0.4 IU/mL (ref 0.30–0.70)

## 2019-06-04 MED ORDER — APIXABAN 5 MG PO TABS
10.0000 mg | ORAL_TABLET | Freq: Two times a day (BID) | ORAL | Status: DC
  Administered 2019-06-04 – 2019-06-07 (×6): 10 mg via ORAL
  Filled 2019-06-04 (×6): qty 2

## 2019-06-04 MED ORDER — APIXABAN 5 MG PO TABS: 5.0000 mg | ORAL_TABLET | Freq: Two times a day (BID) | ORAL | Status: DC

## 2019-06-04 MED ORDER — ORAL CARE MOUTH RINSE
15.0000 mL | Freq: Two times a day (BID) | OROMUCOSAL | Status: DC
  Administered 2019-06-04 – 2019-06-07 (×5): 15 mL via OROMUCOSAL

## 2019-06-04 MED ORDER — POTASSIUM CHLORIDE 20 MEQ PO PACK
20.0000 meq | PACK | Freq: Once | ORAL | Status: AC
  Administered 2019-06-04: 20 meq via ORAL
  Filled 2019-06-04: qty 1

## 2019-06-04 NOTE — Progress Notes (Signed)
Pharmacy Brief Note - Anticoagulation Follow Up:   Patient currently on heparin infusion for acute bilateral PE s/p catheter guided thrombolysis by IR on 12/17.  Venous doppler today reveals right lower extremity VTE. Pharmacy consulted to convert anticoagulation from heparin to apixaban today.   Assessment:  Currently on heparin infusion, most recent level therapeutic  Hgb/Plt stable  Plan:  Discontinue heparin  At the time of heparin discontinuation, initiate apixaban 10 mg PO BID x 7 days followed by apixaban 5 mg PO BID  Check CBC with AM labs tomorrow  Continue to monitor for any signs/symptoms of bleeding  Manufacturer coupon provided  Medication counseling provided  Lenis Noon, PharmD 06/04/19 6:18 PM

## 2019-06-04 NOTE — Progress Notes (Addendum)
NAME:  Ricky Bentley, MRN:  [id number], DOB:  08-20-1965, LOS: 1 ADMISSION DATE:  06/03/2019, CONSULTATION DATE:  12/17 REFERRING MD:  Olevia Bowens, CHIEF COMPLAINT: submassive PE  Brief History   Ricky Bentley is a 53 year old gentleman who presents with submassive PE.  He has had shortness of breath began suddenly 2 days ago, and last night developed chills, coughing, orthopnea, night sweats.  Elevated BNP, troponin, RV strain on echocardiogram.  History of present illness   Ricky Bentley is a 53 y/o gentleman who presented to the ED yesterday with dyspnea on exertion and palpitations that began suddenly 8 days ago.  He began having a mildly productive cough yesterday.  He denies chest pain, hemoptysis, recent leg edema, lightheadedness or dizziness.  He presented to the ED when his saturations dropped into the 80s at home, the lowest being 86%.  He has no previous history of lung disease or DVT.  He has never smoked.  He exercises doing interval training, and was feeling fine before the sudden onset of his symptoms.  He has a brother who has a DVT. There is no family history of miscarriages.  He denies recent prolonged travel, prolonged immobility, surgery.  He denies unexplained bleeding, history of stroke/intracranial bleeding, coagulopathy.  He has lost 30 pounds over the last few months with dietary changes and starting exercising. He denies other new symptoms. He receives testosterone injections at the beginning of every month from his PCP for chronic hypogonadism.  He has no personal history of cancer.  He is a never smoker.  He works as a Forensic psychologist, and has been diligent with wearing a mask, but has been working outside of the home recently.  He has no known Covid contacts.  In the ED he had a CTA demonstrating bilateral PE with RV dilation.  Echocardiogram demonstrates RV strain.  Currently he denies shortness of breath and chest pain.  He is not currently using oxygen and is saturating 95%.  He is not short of breath without it.  Past Medical History  Obesity HTN ADD  Significant Hospital Events     Consults:  PCCM  Procedures:  EKOS started 06/03/2019 at 1900 hrs.  Significant Diagnostic Tests:  12/17 echo- LVEF  90-24%, grade 1 diastolic dysfunction. RV volume and pressure overload with moderately reduced RV systolic function. Trivial TR, TAPSE 74mm. Severely elevated PASP.  12/17 CTA chest-peripheral opacities in the bronchopulmonary distribution of the right upper and right lower lobe.  Otherwise no significant interstitial abnormalities.  Bilateral PEs in left and right mainstem and segmental arteries.  No significant PE.  Dilated right atrium with contrast refluxing down the IVC into liver.  12/17 EKG- Sinus tachycardia, normal axis.  S1T3 w/o Q-waves in III.  T wave inversion in septal precordial leads.  Appropriate R wave progression across precordial's.  QTc prolongation.  Micro Data:  12/17 SARS-CoV-2 pending  Antimicrobials:  Ceftriaxone 12/17>> Azithromycin 12/17>>  Interim history/subjective:  Awake alert no acute distress he has been on EKOS since 1900 hrs. 06/03/2019  Objective   Blood pressure (!) 140/96, pulse 85, temperature 98.2 F (36.8 C), temperature source Oral, resp. rate 15, height 5\' 10"  (1.778 m), weight 103 kg, SpO2 95 %.        Intake/Output Summary (Last 24 hours) at 06/04/2019 0900 Last data filed at 06/04/2019 0700 Gross per 24 hour  Intake 2753.13 ml  Output 500 ml  Net 2253.13 ml   Filed Weights   06/03/19 0932 06/03/19 1600  Weight: 104.3 kg 103 kg    Examination: General: Obese male no acute distress HEENT: No JVD or lymphadenopathy is appreciated Neuro: Grossly intact no focal defects CV: Heart sounds are regular regular rate and rhythm, tachycardic activity   GI: soft, bsx4 active  Extremities: warm/dry,  edema  Skin: no rashes or lesions   Resolved Hospital Problem list     Assessment & Plan:    Acute hypoxic respiratory failure due to bilateral submassive PE.  I suspect that his peripheral infiltrates may be infarcted lung.  EKOS started 06/03/2019 and 1900 hrs. Currently on heparin drip per pharmacy O2 as needed Covid test is negative He will most likely need lifelong anticoagulation as he has a family member with a  clotting disorder. Currently on day 1 of Rocephin and Zithromax as CT scan is more indicative of pulmonary infarct and infection.  History of presenting illness matches PE rather than infectious process  06/04/2019 pulmonary critical care will sign off at this time.  He has a follow-up appointment established with Dr. Chestine Spore for January 26 at 1:30 PM as a pulmonary follow-up.  Best practice:  Diet: Advance per primary Pain/Anxiety/Delirium protocol (if indicated): Per primary VAP protocol (if indicated): n/a DVT prophylaxis: Heparin infusion GI prophylaxis: Not required Mobility: Bedrest Code Status: Full Family Communication: Patient updated at bedside 06/04/2019 he is alert and orientated Updated family. Disposition: ICU  Labs   CBC: Recent Labs  Lab 06/03/19 1028 06/03/19 1948 06/04/19 0107 06/04/19 0815  WBC 9.7 9.6 10.3 8.6  NEUTROABS 6.9  --   --   --   HGB 16.6 15.4 15.0 14.5  HCT 49.5 45.9 45.4 44.0  MCV 89.7 89.6 89.5 89.6  PLT 291 272 228 199    Basic Metabolic Panel: Recent Labs  Lab 06/03/19 1028 06/03/19 1215 06/04/19 0107  NA 137  --  138  K 4.0  --  3.8  CL 103  --  105  CO2 22  --  21*  GLUCOSE 116*  --  117*  BUN 25*  --  20  CREATININE 1.69*  --  1.45*  CALCIUM 9.1  --  8.4*  MG  --  2.1  --    GFR: Estimated Creatinine Clearance: 72.5 mL/min (A) (by C-G formula based on SCr of 1.45 mg/dL (H)). Recent Labs  Lab 06/03/19 1028 06/03/19 1722 06/03/19 1948 06/04/19 0107 06/04/19 0815  PROCALCITON  --  <0.10  --   --   --   WBC 9.7  --  9.6 10.3 8.6    Liver Function Tests: Recent Labs  Lab 06/04/19 0107   AST 22  ALT 43  ALKPHOS 49  BILITOT 1.2  PROT 6.4*  ALBUMIN 3.4*   No results for input(s): LIPASE, AMYLASE in the last 168 hours. No results for input(s): AMMONIA in the last 168 hours.  ABG No results found for: PHART, PCO2ART, PO2ART, HCO3, TCO2, ACIDBASEDEF, O2SAT   Coagulation Profile: Recent Labs  Lab 06/03/19 1215  INR 1.0    Cardiac Enzymes: No results for input(s): CKTOTAL, CKMB, CKMBINDEX, TROPONINI in the last 168 hours.   High- Sensitivity troponin 72--> 80 BNP 589.8   HbA1C: No results found for: HGBA1C  CBG: No results for input(s): GLUCAP in the last 168 hours.   Brett Canales Duanna Runk ACNP Acute Care Nurse Practitioner [facility]/Critical [facility] Please consult Amion 06/04/2019, 9:01 AM

## 2019-06-04 NOTE — Progress Notes (Signed)
Notified MD Dahal that pts vascular ultrasound resulted with right DVTs. MD said pt could still ambulate, as long as it was not strenuous activity.

## 2019-06-04 NOTE — Discharge Instructions (Signed)
Information on my medicine - ELIQUIS (apixaban)  This medication education was reviewed with me or my healthcare representative as part of my discharge preparation.   Why was Eliquis prescribed for you? Eliquis was prescribed to treat blood clots that may have been found in the veins of your legs (deep vein thrombosis) or in your lungs (pulmonary embolism) and to reduce the risk of them occurring again.  What do You need to know about Eliquis ? The starting dose is 10 mg (two 5 mg tablets) taken TWICE daily for the FIRST SEVEN (7) DAYS, then on 06/11/19  the dose is reduced to ONE 5 mg tablet taken TWICE daily.  Eliquis may be taken with or without food.   Try to take the dose about the same time in the morning and in the evening. If you have difficulty swallowing the tablet whole please discuss with your pharmacist how to take the medication safely.  Take Eliquis exactly as prescribed and DO NOT stop taking Eliquis without talking to the doctor who prescribed the medication.  Stopping may increase your risk of developing a new blood clot.  Refill your prescription before you run out.  After discharge, you should have regular check-up appointments with your healthcare provider that is prescribing your Eliquis.    What do you do if you miss a dose? If a dose of ELIQUIS is not taken at the scheduled time, take it as soon as possible on the same day and twice-daily administration should be resumed. The dose should not be doubled to make up for a missed dose.  Important Safety Information A possible side effect of Eliquis is bleeding. You should call your healthcare provider right away if you experience any of the following: ? Bleeding from an injury or your nose that does not stop. ? Unusual colored urine (red or dark brown) or unusual colored stools (red or black). ? Unusual bruising for unknown reasons. ? A serious fall or if you hit your head (even if there is no bleeding).  Some  medicines may interact with Eliquis and might increase your risk of bleeding or clotting while on Eliquis. To help avoid this, consult your healthcare provider or pharmacist prior to using any new prescription or non-prescription medications, including herbals, vitamins, non-steroidal anti-inflammatory drugs (NSAIDs) and supplements.  This website has more information on Eliquis (apixaban): http://www.eliquis.com/eliquis/home

## 2019-06-04 NOTE — Progress Notes (Addendum)
ANTICOAGULATION CONSULT NOTE - Follow Up Consult  Pharmacy Consult for Heparin Indication: pulmonary embolus  No Known Allergies  Patient Measurements: Height: 5\' 10"  (177.8 cm) Weight: 227 lb 1.2 oz (103 kg) IBW/kg (Calculated) : 73 HEPARIN DW (KG): 94.8   Vital Signs: Temp: 98.2 F (36.8 C) (12/18 0800) Temp Source: Oral (12/18 0800) BP: 140/96 (12/18 0800) Pulse Rate: 85 (12/18 0800)  Labs: Recent Labs    06/03/19 1028 06/03/19 1215 06/03/19 1948 06/04/19 0107 06/04/19 0815  HGB 16.6  --  15.4 15.0 14.5  HCT 49.5  --  45.9 45.4 44.0  PLT 291  --  272 228 199  APTT  --  37*  --   --   --   LABPROT  --  13.3  --   --   --   INR  --  1.0  --   --   --   HEPARINUNFRC  --   --  0.11* 0.23* 0.29*  CREATININE 1.69*  --   --  1.45*  --   TROPONINIHS 72* 80*  --   --   --     Estimated Creatinine Clearance: 72.5 mL/min (A) (by C-G formula based on SCr of 1.45 mg/dL (H)).   Medications:  Infusions:  . sodium chloride 20.8 mL/hr at 06/04/19 0619  . sodium chloride 20.8 mL/hr at 06/04/19 0619  . sodium chloride    . azithromycin    . cefTRIAXone (ROCEPHIN)  IV    . heparin 2,050 Units/hr (06/04/19 0700)    Assessment: 33 yoM presenting with cough, SOB. CT angio chest revealed extensive bilateral submassive PE with evidence of right heart strain. Pharmacy consulted to dose heparin. Pt not taking anticoagulants PTA.  Significant events: S/p EKOS 12/17 (received total 24mg  alteplase)  06/04/2019:  Heparin level 0.29 on 2050 units/hr of heparin.  Level slightly below goal, drawn 5h after rate increase so may not be at true steady state.  Fibrinogen 500   CBC: WNL  No bleeding or infusion related issues reported by nursing  Goal of Therapy:  Heparin level 0.3-0.7 units/ml Monitor platelets by anticoagulation protocol: Yes   Plan:  Increase heparin to 2200 units/hr Recheck 6h level at 1500 Daily heparin level & CBC while on heparin F/U plans for long-term  anticoagulation  Netta Cedars, PharmD, BCPS 06/04/2019,10:05 AM

## 2019-06-04 NOTE — Progress Notes (Signed)
PROGRESS NOTE  Ricky Bentley  DOB: [birth year]  PCP: Renford Dills, MD WYO:[id number]  DOA: 06/03/2019  LOS: 1 day   Chief Complaint  Patient presents with  . Shortness of Breath  . Cough   Brief narrative:  Ricky Bentley is a 53 y.o. male with medical history significant of hypertension, obesity, hypogonadism. Patient presented to the ED on 12/17 with complaint of progressively worsening dyspnea particularly on exertion, mild dizziness, palpitations which started 8 days ago as he was walking up the stairs of his home.  Patient reports history of DVT in his brother who is on Coumadin.  Patient gets monthly injections of testosterone.    ED Course: Initial vital signs temperature 97.3, pulse 108, respirations 20, blood pressure 156/113 mmHg and O2 sat 94% on room air.   CBC was normal.  SARS was negative.  Troponin I was 72 than 80 ng/L.  PT was 13.3, INR 1.0 and PTT 37.  BNP was 589.8 pg milliliter.  BMP shows normal electrolytes.  Glucose 116, BUN 25 and creatinine 1.69 mg/dL.  Baseline creatinine as above 0.3 2.4 lower.  CT pulmonary angiogram showed extensive bilateral pulmonary embolus with pulmonary emboli extending from the main pulmonary outflow tracks throughout the upper and lower lobe pulmonary artery systems bilaterally. Positivefor acute PE with CT evidence of right heart strain (RV/LV Ratio = 2.3) consistent with at least submassive (intermediate risk) PE.   Patient underwent catheter guided thrombolysis of pulmonary artery embolism by IR last night.  Subjective: Patient was seen and examined this morning.  Pleasant middle-aged male.  Not in distress.  Feels better.  Not on oxygen supplementation.  Assessment/Plan: Bilateral pulmonary embolism Underwent EKOS Currently on heparin drip.  Switch to Eliquis today. Iron critical care consult appreciated.  Hypertension On hold is HCTZ.  Resume if renal function is normal tomorrow.  Obesity Lifestyle  modification suggested.  CKD (chronic kidney disease) stage 3, GFR 30-59 ml/min HCTZ on hold. Monitor BUN, creatinine and electrolytes  Mobility: Encourage ambulation Diet: Cardiac diet Fluid: No IV fluids DVT prophylaxis:  Eliquis to be started today Code Status:  Full code Family Communication:  None at bedside Expected Discharge:  Hopefully home tomorrow  Consultants:  Critical care, IR  Procedures:  EKOS yesterday  Antimicrobials: Anti-infectives (From admission, onward)   Start     Dose/Rate Route Frequency Ordered Stop   06/04/19 1400  azithromycin (ZITHROMAX) 500 mg in sodium chloride 0.9 % 250 mL IVPB     500 mg 250 mL/hr over 60 Minutes Intravenous Every 24 hours 06/03/19 1405 06/08/19 1359   06/04/19 1200  cefTRIAXone (ROCEPHIN) 2 g in sodium chloride 0.9 % 100 mL IVPB     2 g 200 mL/hr over 30 Minutes Intravenous Every 24 hours 06/03/19 1405 06/09/19 1159   06/03/19 1356  sodium chloride 0.9 % with azithromycin (ZITHROMAX) ADS Med    Note to Pharmacy: Red Christians   : cabinet override      06/03/19 1356 06/04/19 0159   06/03/19 1300  cefTRIAXone (ROCEPHIN) 1 g in sodium chloride 0.9 % 100 mL IVPB     1 g 200 mL/hr over 30 Minutes Intravenous  Once 06/03/19 1203 06/03/19 1349   06/03/19 1300  azithromycin (ZITHROMAX) 500 mg in sodium chloride 0.9 % 250 mL IVPB     500 mg 250 mL/hr over 60 Minutes Intravenous  Once 06/03/19 1203 06/03/19 1558        Code Status: Full Code   Diet Order  Diet Heart Room service appropriate? Yes; Fluid consistency: Thin  Diet effective now              Infusions:  . sodium chloride 20.8 mL/hr at 06/04/19 0619  . sodium chloride 20.8 mL/hr at 06/04/19 0619  . sodium chloride    . azithromycin 500 mg (06/04/19 1430)  . cefTRIAXone (ROCEPHIN)  IV Stopped (06/04/19 1155)  . heparin 2,200 Units/hr (06/04/19 1200)    Scheduled Meds: . Chlorhexidine Gluconate Cloth  6 each Topical Daily  . mouth rinse  15  mL Mouth Rinse BID  . sodium chloride flush  3 mL Intravenous Q12H    PRN meds: sodium chloride, acetaminophen **OR** acetaminophen, prochlorperazine, sodium chloride flush   Objective: Vitals:   06/04/19 0800 06/04/19 1210  BP: (!) 140/96 136/87  Pulse: 85 77  Resp: 15 (!) 29  Temp: 98.2 F (36.8 C) 98 F (36.7 C)  SpO2: 95% 90%    Intake/Output Summary (Last 24 hours) at 06/04/2019 1515 Last data filed at 06/04/2019 1200 Gross per 24 hour  Intake 2565.96 ml  Output 500 ml  Net 2065.96 ml   Filed Weights   06/03/19 0932 06/03/19 1600  Weight: 104.3 kg 103 kg   Weight change:  Body mass index is 32.58 kg/m.   Physical Exam: General exam: Appears calm and comfortable.  Skin: No rashes, lesions or ulcers. HEENT: Atraumatic, normocephalic, supple neck, no obvious bleeding Lungs: Clear to auscultation bilaterally CVS: Regular rate and rhythm, no murmur GI/Abd soft, nontender, nondistended, bowel somnolent CNS: Alert, awake, oriented x3 Psychiatry: -Mood appropriate Extremities: No pedal edema, no calf tenderness  Data Review: I have personally reviewed the laboratory data and studies available.  Recent Labs  Lab 06/03/19 1028 06/03/19 1948 06/04/19 0107 06/04/19 0815 06/04/19 1431  WBC 9.7 9.6 10.3 8.6 8.5  NEUTROABS 6.9  --   --   --   --   HGB 16.6 15.4 15.0 14.5 15.0  HCT 49.5 45.9 45.4 44.0 45.1  MCV 89.7 89.6 89.5 89.6 90.2  PLT 291 272 228 199 213   Recent Labs  Lab 06/03/19 1028 06/03/19 1215 06/04/19 0107  NA 137  --  138  K 4.0  --  3.8  CL 103  --  105  CO2 22  --  21*  GLUCOSE 116*  --  117*  BUN 25*  --  20  CREATININE 1.69*  --  1.45*  CALCIUM 9.1  --  8.4*  MG  --  2.1  --     Terrilee Croak, MD  [facility] 06/04/2019

## 2019-06-04 NOTE — Progress Notes (Signed)
ANTICOAGULATION CONSULT NOTE - Follow Up Consult  Pharmacy Consult for Heparin Indication: pulmonary embolus  No Known Allergies  Patient Measurements: Height: 5\' 10"  (177.8 cm) Weight: 227 lb 1.2 oz (103 kg) IBW/kg (Calculated) : 73 Heparin Dosing Weight:   Vital Signs: Temp: 99.1 F (37.3 C) (12/17 2338) Temp Source: Oral (12/17 2338) BP: 125/79 (12/18 0200) Pulse Rate: 95 (12/18 0200)  Labs: Recent Labs    06/03/19 1028 06/03/19 1215 06/03/19 1722 06/03/19 1948 06/04/19 0107  HGB 16.6  --   --  15.4 15.0  HCT 49.5  --   --  45.9 45.4  PLT 291  --   --  272 228  APTT  --  37*  --   --   --   LABPROT  --  13.3  --   --   --   INR  --  1.0  --   --   --   HEPARINUNFRC  --   --  0.17* 0.11* 0.23*  CREATININE 1.69*  --   --   --  1.45*  TROPONINIHS 72* 80*  --   --   --     Estimated Creatinine Clearance: 72.5 mL/min (A) (by C-G formula based on SCr of 1.45 mg/dL (H)).   Medications:  Infusions:  . sodium chloride    . sodium chloride    . sodium chloride    . alteplase (tPA/ ACTIVASE) PE Lysis 12 mg/250 mL (BILATERAL) 12 mg (06/03/19 1919)  . alteplase (tPA/ ACTIVASE) PE Lysis 12 mg/250 mL (BILATERAL) 12 mg (06/03/19 1909)  . azithromycin    . cefTRIAXone (ROCEPHIN)  IV    . heparin 1,900 Units/hr (06/04/19 0200)    Assessment: Patient with low heparin level.  No heparin issues per RN.    Goal of Therapy:  Heparin level 0.3-0.7 units/ml Monitor platelets by anticoagulation protocol: Yes   Plan:  Increase heparin to 2050 units/hr Recheck level at 0900.  Tyler Deis, Shea Stakes [place] 06/04/2019,3:02 AM

## 2019-06-04 NOTE — Progress Notes (Signed)
ANTICOAGULATION CONSULT NOTE - Follow Up Consult  Pharmacy Consult for Heparin Indication: pulmonary embolus  No Known Allergies  Patient Measurements: Height: 5\' 10"  (177.8 cm) Weight: 227 lb 1.2 oz (103 kg) IBW/kg (Calculated) : 73 HEPARIN DW (KG): 94.8   Vital Signs: Temp: 98 F (36.7 C) (12/18 1210) Temp Source: Oral (12/18 1210) BP: 136/87 (12/18 1210) Pulse Rate: 77 (12/18 1210)  Labs: Recent Labs    06/03/19 1028 06/03/19 1215 06/03/19 1722 06/04/19 0107 06/04/19 0815 06/04/19 1431  HGB 16.6  --    < > 15.0 14.5 15.0  HCT 49.5  --    < > 45.4 44.0 45.1  PLT 291  --    < > 228 199 213  APTT  --  37*  --   --   --   --   LABPROT  --  13.3  --   --   --   --   INR  --  1.0  --   --   --   --   HEPARINUNFRC  --   --   --  0.23* 0.29* 0.40  CREATININE 1.69*  --   --  1.45*  --   --   TROPONINIHS 72* 80*  --   --   --   --    < > = values in this interval not displayed.    Estimated Creatinine Clearance: 72.5 mL/min (A) (by C-G formula based on SCr of 1.45 mg/dL (H)).   Medications:  Infusions:  . sodium chloride 20.8 mL/hr at 06/04/19 0619  . sodium chloride 20.8 mL/hr at 06/04/19 0619  . sodium chloride    . azithromycin 500 mg (06/04/19 1430)  . cefTRIAXone (ROCEPHIN)  IV Stopped (06/04/19 1155)  . heparin 2,200 Units/hr (06/04/19 1200)    Assessment: 58 yoM presenting with cough, SOB. CT angio chest revealed extensive bilateral submassive PE with evidence of right heart strain. Pharmacy consulted to dose heparin. Pt not taking anticoagulants PTA.  Significant events: S/p EKOS 12/17 (received total 24mg  alteplase)  06/04/2019:  Heparin level 0.29 on 2050 units/hr of heparin.  Level slightly below goal, drawn 5h after rate increase so may not be at true steady state.  Fibrinogen 500   CBC: WNL  No bleeding or infusion related issues reported by nursing  Goal of Therapy:  Heparin level 0.3-0.7 units/ml Monitor platelets by anticoagulation  protocol: Yes   Plan:  Increase heparin to 2200 units/hr Recheck 6h level at 1500 Daily heparin level & CBC while on heparin F/U plans for long-term anticoagulation  Netta Cedars, PharmD, BCPS 06/04/2019  Addendum: Repeat heparin level now therapeutic (0.4) on 2200 units/hr. F/U daily heparin level Netta Cedars, PharmD, BCPS 06/04/2019@3 :16 PM

## 2019-06-04 NOTE — Progress Notes (Signed)
Bilateral lower extremity venous duplex has been completed. Preliminary results can be found in CV Proc through chart review.  Results were given to the patient's nurse, Mel Almond.  06/04/19 2:29 PM Carlos Levering RVT

## 2019-06-05 LAB — CBC
HCT: 44.1 % (ref 39.0–52.0)
Hemoglobin: 14.4 g/dL (ref 13.0–17.0)
MCH: 29.6 pg (ref 26.0–34.0)
MCHC: 32.7 g/dL (ref 30.0–36.0)
MCV: 90.6 fL (ref 80.0–100.0)
Platelets: 209 10*3/uL (ref 150–400)
RBC: 4.87 MIL/uL (ref 4.22–5.81)
RDW: 13.2 % (ref 11.5–15.5)
WBC: 9 10*3/uL (ref 4.0–10.5)
nRBC: 0 % (ref 0.0–0.2)

## 2019-06-05 MED ORDER — APIXABAN 5 MG PO TABS: ORAL_TABLET | ORAL | 0 refills | Status: DC

## 2019-06-05 MED ORDER — AZITHROMYCIN 250 MG PO TABS
500.0000 mg | ORAL_TABLET | Freq: Every day | ORAL | Status: AC
  Administered 2019-06-05 – 2019-06-07 (×3): 500 mg via ORAL
  Filled 2019-06-05 (×3): qty 2

## 2019-06-05 NOTE — Progress Notes (Signed)
PHARMACIST - PHYSICIAN COMMUNICATION DR:   Pietro Cassis CONCERNING: Antibiotic IV to Oral Route Change Policy  RECOMMENDATION: This patient is receiving Zithromax by the intravenous route.  Based on criteria approved by the Pharmacy and Therapeutics Committee, the antibiotic(s) is/are being converted to the equivalent oral dose form(s).   DESCRIPTION: These criteria include:  Patient being treated for a respiratory tract infection, urinary tract infection, cellulitis or clostridium difficile associated diarrhea if on metronidazole  The patient is not neutropenic and does not exhibit a GI malabsorption state  The patient is eating (either orally or via tube) and/or has been taking other orally administered medications for a least 24 hours  The patient is improving clinically and has a Tmax < 100.5  If you have questions about this conversion, please contact the Pharmacy Department  []   [phone] )  [facility] []   [phone] )  [facility] []   [phone] )  [facility] []   [phone] )  [facility] [x]   [phone] )  [place], PharmD, BCPS 06/05/2019@12 :24 PM

## 2019-06-05 NOTE — Progress Notes (Signed)
Assumed care of patient transferred from ICU to floor. Patient is alert, talking, pleasant accompanied by spouse. Patient with no signs and symptoms of distress; no complaints. Disconnected from portable o2 and attached to wall;  call bell in reach.

## 2019-06-05 NOTE — Progress Notes (Signed)
PROGRESS NOTE  Ricky Bentley  DOB: [birth year]  PCP: Renford Dills, MD TFT:[id number]  DOA: 06/03/2019  LOS: 2 days   Chief Complaint  Patient presents with  . Shortness of Breath  . Cough   Brief narrative: Ricky Bentley is a 53 y.o. male with medical history significant of hypertension, obesity, hypogonadism. Patient presented to the ED on 12/17 with complaint of progressively worsening dyspnea particularly on exertion, mild dizziness, palpitations which started 8 days ago as he was walking up the stairs of his home.  Patient reports history of DVT in his brother who is on Coumadin.  Patient gets monthly injections of testosterone.    ED Course: Initial vital signs temperature 97.3, pulse 108, respirations 20, blood pressure 156/113 mmHg and O2 sat 94% on room air.   CBC was normal.  SARS was negative.  Troponin I was 72 than 80 ng/L.  PT was 13.3, INR 1.0 and PTT 37.  BNP was 589.8 pg milliliter.  BMP shows normal electrolytes.  Glucose 116, BUN 25 and creatinine 1.69 mg/dL.  Baseline creatinine as above 0.3 2.4 lower.  CT pulmonary angiogram showed extensive bilateral pulmonary embolus with pulmonary emboli extending from the main pulmonary outflow tracks throughout the upper and lower lobe pulmonary artery systems bilaterally. Positivefor acute PE with CT evidence of right heart strain (RV/LV Ratio = 2.3) consistent with at least submassive (intermediate risk) PE.   Patient underwent catheter guided thrombolysis of pulmonary artery embolism by IR.  Subjective: Patient was seen and examined this morning.  Pleasant middle-aged male.  Not in distress.  Feels better.  Not on oxygen supplementation at rest but on ambulation patient's O2 sat dropped to 80s and required to be on oxygen.  Assessment/Plan: Bilateral pulmonary embolism Acute respiratory failure with hypoxia Underwent EKOS followed by heparin drip. Switch to Eliquis yesterday.  IR and critical care consult  appreciated. Currently not on oxygen medicine at rest but on minimal ambulation, patient is significantly hypoxic and is requiring O2 via nasal cannula. -Expect improvement in oxygen requirement tomorrow.  Hypertension On hold is HCTZ.  Blood pressure in normal range.  Will resume if elevated tomorrow.    Obesity Lifestyle modification suggested.  CKD (chronic kidney disease) stage 3, GFR 30-59 ml/min HCTZ on hold. Monitor BUN, creatinine and electrolytes  Mobility: Encourage ambulation Diet: Cardiac diet Fluid: No IV fluids DVT prophylaxis:  Eliquis  Code Status:  Full code Family Communication:  None at bedside Expected Discharge:  Unable to discharge today because of significant shortness of breath and hypoxia on minimal ambulation.  Hopefully home tomorrow  Consultants:  Critical care, IR  Procedures:  EKOS yesterday  Antimicrobials: Anti-infectives (From admission, onward)   Start     Dose/Rate Route Frequency Ordered Stop   06/05/19 1230  azithromycin (ZITHROMAX) tablet 500 mg     500 mg Oral Daily 06/05/19 1224 06/08/19 0959   06/04/19 1400  azithromycin (ZITHROMAX) 500 mg in sodium chloride 0.9 % 250 mL IVPB  Status:  Discontinued     500 mg 250 mL/hr over 60 Minutes Intravenous Every 24 hours 06/03/19 1405 06/05/19 1224   06/04/19 1200  cefTRIAXone (ROCEPHIN) 2 g in sodium chloride 0.9 % 100 mL IVPB     2 g 200 mL/hr over 30 Minutes Intravenous Every 24 hours 06/03/19 1405 06/09/19 1159   06/03/19 1356  sodium chloride 0.9 % with azithromycin (ZITHROMAX) ADS Med    Note to Pharmacy: Red Christians   : cabinet override  06/03/19 1356 06/04/19 0159   06/03/19 1300  cefTRIAXone (ROCEPHIN) 1 g in sodium chloride 0.9 % 100 mL IVPB     1 g 200 mL/hr over 30 Minutes Intravenous  Once 06/03/19 1203 06/03/19 1349   06/03/19 1300  azithromycin (ZITHROMAX) 500 mg in sodium chloride 0.9 % 250 mL IVPB     500 mg 250 mL/hr over 60 Minutes Intravenous  Once 06/03/19  1203 06/03/19 1558        Code Status: Full Code   Diet Order            Diet Heart Room service appropriate? Yes; Fluid consistency: Thin  Diet effective now              Infusions:  . sodium chloride 20.8 mL/hr at 06/04/19 0619  . sodium chloride 20.8 mL/hr at 06/04/19 0619  . sodium chloride    . cefTRIAXone (ROCEPHIN)  IV 2 g (06/05/19 1211)    Scheduled Meds: . apixaban  10 mg Oral BID   Followed by  . [START ON 06/11/2019] apixaban  5 mg Oral BID  . azithromycin  500 mg Oral Daily  . Chlorhexidine Gluconate Cloth  6 each Topical Daily  . mouth rinse  15 mL Mouth Rinse BID  . sodium chloride flush  3 mL Intravenous Q12H    PRN meds: sodium chloride, acetaminophen **OR** acetaminophen, prochlorperazine, sodium chloride flush   Objective: Vitals:   06/05/19 0900 06/05/19 1209  BP: 140/78   Pulse: 81   Resp: 16   Temp:  98.3 F (36.8 C)  SpO2: 93%     Intake/Output Summary (Last 24 hours) at 06/05/2019 1236 Last data filed at 06/05/2019 0918 Gross per 24 hour  Intake 672.62 ml  Output 300 ml  Net 372.62 ml   Filed Weights   06/03/19 0932 06/03/19 1600  Weight: 104.3 kg 103 kg   Weight change:  Body mass index is 32.58 kg/m.   Physical Exam: General exam: Appears calm and comfortable.  Skin: No rashes, lesions or ulcers. HEENT: Atraumatic, normocephalic, supple neck, no obvious bleeding Lungs: Clear to auscultation bilaterally CVS: Regular rate and rhythm, no murmur GI/Abd soft, nontender, nondistended, bowel somnolent CNS: Alert, awake, oriented x3 Psychiatry: Mood appropriate Extremities: No pedal edema, no calf tenderness  Data Review: I have personally reviewed the laboratory data and studies available.  Recent Labs  Lab 06/03/19 1028 06/03/19 1948 06/04/19 0107 06/04/19 0815 06/04/19 1431 06/05/19 0124  WBC 9.7 9.6 10.3 8.6 8.5 9.0  NEUTROABS 6.9  --   --   --   --   --   HGB 16.6 15.4 15.0 14.5 15.0 14.4  HCT 49.5 45.9 45.4  44.0 45.1 44.1  MCV 89.7 89.6 89.5 89.6 90.2 90.6  PLT 291 272 228 199 213 209   Recent Labs  Lab 06/03/19 1028 06/03/19 1215 06/04/19 0107  NA 137  --  138  K 4.0  --  3.8  CL 103  --  105  CO2 22  --  21*  GLUCOSE 116*  --  117*  BUN 25*  --  20  CREATININE 1.69*  --  1.45*  CALCIUM 9.1  --  8.4*  MG  --  2.1  --     Terrilee Croak, MD  [facility] 06/05/2019

## 2019-06-06 DIAGNOSIS — R0600 Dyspnea, unspecified: Secondary | ICD-10-CM

## 2019-06-06 LAB — CULTURE, RESPIRATORY W GRAM STAIN: Culture: NORMAL

## 2019-06-06 LAB — CBC
HCT: 44.1 % (ref 39.0–52.0)
Hemoglobin: 14.3 g/dL (ref 13.0–17.0)
MCH: 29.4 pg (ref 26.0–34.0)
MCHC: 32.4 g/dL (ref 30.0–36.0)
MCV: 90.6 fL (ref 80.0–100.0)
Platelets: 247 10*3/uL (ref 150–400)
RBC: 4.87 MIL/uL (ref 4.22–5.81)
RDW: 13.2 % (ref 11.5–15.5)
WBC: 8 10*3/uL (ref 4.0–10.5)
nRBC: 0 % (ref 0.0–0.2)

## 2019-06-06 NOTE — Progress Notes (Signed)
Pt ambulated in hallway. Highest 02 saturation noted to be 90%. MD made aware

## 2019-06-06 NOTE — Progress Notes (Signed)
PROGRESS NOTE    Ricky Bentley  CBJ:[id number] DOB: 1965/09/12 DOA: 06/03/2019 PCP: Renford Dills, MD    Brief Narrative:  53 y.o.malewith medical history significant ofhypertension, obesity, hypogonadism. Patient presented to the ED on 12/17 with complaint of progressively worsening dyspneaparticularly on exertion, mild dizziness, palpitations which started 8 days ago as he was walking up the stairs of his home.  Patient reports history of DVT in his brother who is on Coumadin.  Patient gets monthly injections of testosterone.  ED Course:Initial vital signs temperature 97.3, pulse 108, respirations 20, blood pressure 156/113 mmHg and O2 sat 94% on room air.  CBC was normal. SARS was negative. Troponin I was 72 than 80 ng/L. PT was 13.3, INR 1.0 and PTT 37. BNP was 589.8 pg milliliter. BMP shows normal electrolytes. Glucose 116, BUN 25 and creatinine 1.69 mg/dL. Baseline creatinine as above 0.3 2.4 lower.  CT pulmonary angiogram showed extensive bilateral pulmonary embolus with pulmonary emboli extending from the main pulmonary outflow tracks throughout the upper and lower lobe pulmonary artery systems bilaterally. Positivefor acute PE with CT evidence of right heart strain (RV/LV Ratio = 2.3) consistent with at least submassive (intermediate risk) PE.  Patient underwent catheter guided thrombolysis of pulmonary artery embolism by IR.  Assessment & Plan:   Principal Problem:   Bilateral pulmonary embolism (HCC) Active Problems:   Hypertension   Obesity (BMI 30.0-34.9)   CKD (chronic kidney disease) stage 3, GFR 30-59 ml/min   CAP (community acquired pneumonia)  Bilateral pulmonary embolism Acute respiratory failure with hypoxia -Pt underwent EKOS followed by heparin drip and later transitioned to eliquis on 12/19 -Pt had been followed by IR and CCM -Patient improving, however continues to desaturate to the 70's on ambulation on RA with increased SOB -Cont to  wean O2 and cont to reassess   Hypertension -On hold is HCTZ.   -Blood pressure stable currently. -Plan to resume home meds on d/c    CKD (chronic kidney disease) stage 3, GFR 30-59 ml/min -HCTZ had remained on hold -Cont to monitor lytes and renal function  DVT prophylaxis: Eliquis Code Status: Full Family Communication: Pt in room, family not at bedside Disposition Plan: Possible d/c home when able to ambulate on RA  Consultants:   IR  CCM  Procedures:   TPA on 12/17  Antimicrobials: Anti-infectives (From admission, onward)   Start     Dose/Rate Route Frequency Ordered Stop   06/05/19 1230  azithromycin (ZITHROMAX) tablet 500 mg     500 mg Oral Daily 06/05/19 1224 06/08/19 0959   06/04/19 1400  azithromycin (ZITHROMAX) 500 mg in sodium chloride 0.9 % 250 mL IVPB  Status:  Discontinued     500 mg 250 mL/hr over 60 Minutes Intravenous Every 24 hours 06/03/19 1405 06/05/19 1224   06/04/19 1200  cefTRIAXone (ROCEPHIN) 2 g in sodium chloride 0.9 % 100 mL IVPB     2 g 200 mL/hr over 30 Minutes Intravenous Every 24 hours 06/03/19 1405 06/09/19 1159   06/03/19 1356  sodium chloride 0.9 % with azithromycin (ZITHROMAX) ADS Med    Note to Pharmacy: Red Christians   : cabinet override      06/03/19 1356 06/04/19 0159   06/03/19 1300  cefTRIAXone (ROCEPHIN) 1 g in sodium chloride 0.9 % 100 mL IVPB     1 g 200 mL/hr over 30 Minutes Intravenous  Once 06/03/19 1203 06/03/19 1349   06/03/19 1300  azithromycin (ZITHROMAX) 500 mg in sodium chloride 0.9 % 250  mL IVPB     500 mg 250 mL/hr over 60 Minutes Intravenous  Once 06/03/19 1203 06/03/19 1558       Subjective: Eager to go home soon  Objective: Vitals:   06/05/19 1620 06/05/19 2039 06/06/19 0536 06/06/19 1334  BP: 139/88 135/90 125/79 127/87  Pulse: 82 82 79 92  Resp: (!) 22 18 16 18   Temp: 97.6 F (36.4 C) 97.6 F (36.4 C) 98.4 F (36.9 C) 98.5 F (36.9 C)  TempSrc: Oral Oral Oral Oral  SpO2: 95% 94% 91% 93%    Weight:      Height:        Intake/Output Summary (Last 24 hours) at 06/06/2019 1531 Last data filed at 06/06/2019 1300 Gross per 24 hour  Intake 240 ml  Output --  Net 240 ml   Filed Weights   06/03/19 0932 06/03/19 1600  Weight: 104.3 kg 103 kg    Examination:  General exam: Appears calm and comfortable  Respiratory system: Clear to auscultation. Respiratory effort normal. Cardiovascular system: S1 & S2 heard, Regular Gastrointestinal system: Abdomen is nondistended, soft and nontender. No organomegaly or masses felt. Normal bowel sounds heard. Central nervous system: Alert and oriented. No focal neurological deficits. Extremities: Symmetric 5 x 5 power. Skin: No rashes, lesions Psychiatry: Judgement and insight appear normal. Mood & affect appropriate.   Data Reviewed: I have personally reviewed following labs and imaging studies  CBC: Recent Labs  Lab 06/03/19 1028 06/04/19 0107 06/04/19 0815 06/04/19 1431 06/05/19 0124 06/06/19 0450  WBC 9.7 10.3 8.6 8.5 9.0 8.0  NEUTROABS 6.9  --   --   --   --   --   HGB 16.6 15.0 14.5 15.0 14.4 14.3  HCT 49.5 45.4 44.0 45.1 44.1 44.1  MCV 89.7 89.5 89.6 90.2 90.6 90.6  PLT 291 228 199 213 209 371   Basic Metabolic Panel: Recent Labs  Lab 06/03/19 1028 06/03/19 1215 06/04/19 0107  NA 137  --  138  K 4.0  --  3.8  CL 103  --  105  CO2 22  --  21*  GLUCOSE 116*  --  117*  BUN 25*  --  20  CREATININE 1.69*  --  1.45*  CALCIUM 9.1  --  8.4*  MG  --  2.1  --    GFR: Estimated Creatinine Clearance: 72.5 mL/min (A) (by C-G formula based on SCr of 1.45 mg/dL (H)). Liver Function Tests: Recent Labs  Lab 06/04/19 0107  AST 22  ALT 43  ALKPHOS 49  BILITOT 1.2  PROT 6.4*  ALBUMIN 3.4*   No results for input(s): LIPASE, AMYLASE in the last 168 hours. No results for input(s): AMMONIA in the last 168 hours. Coagulation Profile: Recent Labs  Lab 06/03/19 1215  INR 1.0   Cardiac Enzymes: No results for  input(s): CKTOTAL, CKMB, CKMBINDEX, TROPONINI in the last 168 hours. BNP (last 3 results) No results for input(s): PROBNP in the last 8760 hours. HbA1C: No results for input(s): HGBA1C in the last 72 hours. CBG: No results for input(s): GLUCAP in the last 168 hours. Lipid Profile: No results for input(s): CHOL, HDL, LDLCALC, TRIG, CHOLHDL, LDLDIRECT in the last 72 hours. Thyroid Function Tests: No results for input(s): TSH, T4TOTAL, FREET4, T3FREE, THYROIDAB in the last 72 hours. Anemia Panel: No results for input(s): VITAMINB12, FOLATE, FERRITIN, TIBC, IRON, RETICCTPCT in the last 72 hours. Sepsis Labs: Recent Labs  Lab 06/03/19 1722  PROCALCITON <0.10    Recent Results (  from the past 240 hour(s))  SARS CORONAVIRUS 2 (TAT 6-24 HRS) Nasopharyngeal Nasopharyngeal Swab     Status: None   Collection Time: 06/03/19 12:15 PM   Specimen: Nasopharyngeal Swab  Result Value Ref Range Status   SARS Coronavirus 2 NEGATIVE NEGATIVE Final    Comment: (NOTE) SARS-CoV-2 target nucleic acids are NOT DETECTED. The SARS-CoV-2 RNA is generally detectable in upper and lower respiratory specimens during the acute phase of infection. Negative results do not preclude SARS-CoV-2 infection, do not rule out co-infections with other pathogens, and should not be used as the sole basis for treatment or other patient management decisions. Negative results must be combined with clinical observations, patient history, and epidemiological information. The expected result is Negative. Fact Sheet for Patients: HairSlick.no Fact Sheet for Healthcare Providers: quierodirigir.com This test is not yet approved or cleared by the [place] FDA and  has been authorized for detection and/or diagnosis of SARS-CoV-2 by FDA under an Emergency Use Authorization (EUA). This EUA will remain  in effect (meaning this test can be used) for the duration of the COVID-19  declaration under Section 56 4(b)(1) of the Act, 21 U.S.C. section 360bbb-3(b)(1), unless the authorization is terminated or revoked sooner. Performed at [facility], [address] [place], [place] [address]   Culture, blood (routine x 2)     Status: None (Preliminary result)   Collection Time: 06/03/19 12:37 PM   Specimen: BLOOD  Result Value Ref Range Status   Specimen Description   Final    BLOOD BLOOD RIGHT FOREARM Performed at [facility], [address] [place], [place] [address]    Special Requests   Final    BOTTLES DRAWN AEROBIC AND ANAEROBIC Blood Culture adequate volume Performed at [facility], [address], [place], [place] [address]    Culture   Final    NO GROWTH 3 DAYS Performed at [facility], [address], [place], [place] [address]    Report Status PENDING  Incomplete  Culture, blood (routine x 2)     Status: None (Preliminary result)   Collection Time: 06/03/19 12:37 PM   Specimen: BLOOD  Result Value Ref Range Status   Specimen Description   Final    BLOOD RIGHT ANTECUBITAL Performed at [facility], [address], [place], [place] [address]    Special Requests   Final    BOTTLES DRAWN AEROBIC AND ANAEROBIC Blood Culture results may not be optimal due to an excessive volume of blood received in culture bottles Performed at [facility], [address], [place], [place] [address]    Culture   Final    NO GROWTH 3 DAYS Performed at [facility], [address] [place], [place] [address]    Report Status PENDING  Incomplete  Culture, sputum-assessment     Status: None   Collection Time: 06/03/19  2:04 PM   Specimen: Expectorated Sputum  Result Value Ref Range Status   Specimen Description EXPECTORATED SPUTUM  Final   Special Requests NONE  Final   Sputum evaluation   Final    THIS SPECIMEN IS ACCEPTABLE FOR SPUTUM CULTURE Performed at [facility], [address] [place], [place] [address]    Report Status 06/03/2019 FINAL  Final  Culture, respiratory     Status: None   Collection Time: 06/03/19  2:04 PM  Result Value Ref Range Status   Specimen Description  Final    EXPECTORATED SPUTUM Performed at [facility], [address] [place], [place] [address]    Special Requests   Final    NONE Reflexed from [id number] Performed at [facility], [address] [place], [place] [address]    Gram Stain   Final    MODERATE WBC PRESENT,BOTH PMN AND MONONUCLEAR FEW GRAM NEGATIVE RODS FEW GRAM NEGATIVE COCCOBACILLI    Culture   Final    Consistent with normal respiratory flora. Performed at [facility], [address] [place], [place] [address]    Report Status 06/06/2019 FINAL  Final  MRSA PCR Screening     Status: None   Collection Time: 06/03/19  4:13 PM   Specimen: Nasal Mucosa; Nasopharyngeal  Result Value Ref Range Status   MRSA by PCR NEGATIVE NEGATIVE Final    Comment:        The GeneXpert MRSA Assay (FDA approved for NASAL specimens only), is one component of a comprehensive MRSA colonization surveillance program. It is not intended to diagnose MRSA infection nor to guide or monitor treatment for MRSA infections. Performed at [facility], [address] [place], [place] [address]      Radiology Studies: No results found.  Scheduled Meds: . apixaban  10 mg Oral BID   Followed by  . [START ON 06/11/2019] apixaban  5 mg Oral BID  . azithromycin  500 mg Oral Daily  . Chlorhexidine Gluconate Cloth  6 each Topical Daily  . mouth rinse  15 mL Mouth Rinse BID  . sodium chloride flush  3 mL Intravenous Q12H   Continuous Infusions: . sodium chloride 20.8 mL/hr at 06/04/19 0619  . sodium chloride 20.8 mL/hr at 06/04/19 0619  . sodium chloride    . cefTRIAXone (ROCEPHIN)  IV 2 g (06/06/19 1453)     LOS: 3 days   Rickey BarbaraStephen  Jotham Ahn, MD [facility] Pager On Amion  If 7PM-7AM, please contact night-coverage 06/06/2019, 3:31 PM

## 2019-06-07 ENCOUNTER — Telehealth: Payer: Self-pay | Admitting: *Deleted

## 2019-06-07 DIAGNOSIS — I10 Essential (primary) hypertension: Secondary | ICD-10-CM

## 2019-06-07 DIAGNOSIS — I2609 Other pulmonary embolism with acute cor pulmonale: Secondary | ICD-10-CM

## 2019-06-07 LAB — CBC
HCT: 44.4 % (ref 39.0–52.0)
Hemoglobin: 14.6 g/dL (ref 13.0–17.0)
MCH: 29.5 pg (ref 26.0–34.0)
MCHC: 32.9 g/dL (ref 30.0–36.0)
MCV: 89.7 fL (ref 80.0–100.0)
Platelets: 269 10*3/uL (ref 150–400)
RBC: 4.95 MIL/uL (ref 4.22–5.81)
RDW: 12.9 % (ref 11.5–15.5)
WBC: 7.2 10*3/uL (ref 4.0–10.5)
nRBC: 0 % (ref 0.0–0.2)

## 2019-06-07 MED ORDER — APIXABAN 5 MG PO TABS: ORAL_TABLET | ORAL | 0 refills | Status: DC

## 2019-06-07 MED ORDER — CEFPODOXIME PROXETIL 100 MG PO TABS: 100.0000 mg | ORAL_TABLET | Freq: Two times a day (BID) | ORAL | 0 refills | Status: AC

## 2019-06-07 NOTE — Discharge Summary (Signed)
Physician Discharge Summary  Ricky Bentley ZOX:[id number] DOB: 03-Feb-1966 DOA: 06/03/2019  PCP: Renford Dills, MD  Admit date: 06/03/2019 Discharge date: 06/07/2019  Admitted From: Home Disposition:  Home  Recommendations for Outpatient Follow-up:  1. Follow up with PCP in 1-2 weeks 2. Follow up with Pulmonary as scheduled 3. Follow up with Hematology as scheduled  Discharge Condition:Improved CODE STATUS:Full Diet recommendation: Regular   Brief/Interim Summary: 53 y.o.malewith medical history significant ofhypertension, obesity, hypogonadism. Patient presented to the ED on 12/17 with complaint of progressively worsening dyspneaparticularly on exertion, mild dizziness, palpitations which started 8 days ago as he was walking up the stairs of his home.  Patient reports history of DVT in his brother who is on Coumadin. Patient gets monthly injections of testosterone.  ED Course:Initial vital signs temperature 97.3, pulse 108, respirations 20, blood pressure 156/113 mmHg and O2 sat 94% on room air.  CBC was normal. SARS was negative. Troponin I was 72 than 80 ng/L. PT was 13.3, INR 1.0 and PTT 37. BNP was 589.8 pg milliliter. BMP shows normal electrolytes. Glucose 116, BUN 25 and creatinine 1.69 mg/dL. Baseline creatinine as above 0.3 2.4 lower.  CT pulmonary angiogram showed extensive bilateral pulmonary embolus with pulmonary emboli extending from the main pulmonary outflow tracks throughout the upper and lower lobe pulmonary artery systems bilaterally. Positivefor acute PE with CT evidence of right heart strain (RV/LV Ratio = 2.3) consistent with at least submassive (intermediate risk) PE.  Patient underwent catheter guided thrombolysis of pulmonary artery embolism by IR.  Discharge Diagnoses:  Principal Problem:   Bilateral pulmonary embolism (HCC) Active Problems:   Hypertension   Obesity (BMI 30.0-34.9)   CKD (chronic kidney disease) stage 3, GFR 30-59  ml/min   CAP (community acquired pneumonia)  Bilateral pulmonary embolism Acute respiratory failure with hypoxia -Pt underwent EKOSfollowed by heparin drip and later transitioned to eliquis on 12/19 -Pt had been followed by IR and CCM -Patient improved, weaned to room air, maintaining O2 sats on ambulation -Have contacted Hematology on call and have requested arranging outpt follow up. Per Hematology, f/u will be arranged -Patient with infiltrate noted on CXR, had been continued on azithro and rocephin. Complete 2 more days of vantin on d/c  Hypertension -On hold is hydrochlorothiazide while in hospital -Blood pressure stable currently. -resume meds on d/c  CKD (chronic kidney disease) stage 3, GFR 30-59 ml/min -HCTZ held while in hospital -Resume home meds on d/c  Discharge Instructions   Allergies as of 06/07/2019   No Known Allergies     Medication List    STOP taking these medications   TESTOSTERONE CYPIONATE IM     TAKE these medications   apixaban 5 MG Tabs tablet Commonly known as: ELIQUIS 2 tabs twice daily for 7 days followed by 1 tab twice daily for lifelong.   cefpodoxime 100 MG tablet Commonly known as: VANTIN Take 1 tablet (100 mg total) by mouth 2 (two) times daily for 2 days.   hydrochlorothiazide 25 MG tablet Commonly known as: HYDRODIURIL Take 25 mg by mouth daily.      Follow-up Information    Steffanie Dunn, DO Follow up on 07/13/2019.   Specialty: Pulmonary Disease Why: Dr. Chestine Spore @ 1:30 pm Contact information: [address] [place] [address] [phone]        Renford Dills, MD. Schedule an appointment as soon as possible for a visit in 2 week(s).   Specialty: Internal Medicine Contact information: [address] [employer]  [address] [place] [address] [phone]        Follow up with Hematology as scheduled Follow up.          No Known Allergies  Consultations:  PCCM  Procedures/Studies: CT Angio  Chest PE W and/or Wo Contrast  Result Date: 06/03/2019 CLINICAL DATA:  Shortness of breath EXAM: CT ANGIOGRAPHY CHEST WITH CONTRAST TECHNIQUE: Multidetector CT imaging of the chest was performed using the standard protocol during bolus administration of intravenous contrast. Multiplanar CT image reconstructions and MIPs were obtained to evaluate the vascular anatomy. CONTRAST:  OMNIPAQUE IOHEXOL 350 MG/ML SOLN COMPARISON:  Chest radiograph June 03, 2019 FINDINGS: Cardiovascular: There is extensive pulmonary embolus arising in both main pulmonary arteries, more severe on the right than on the left, with extension of pulmonary emboli into multiple upper and lower lobe pulmonary artery branches. The right ventricle to left ventricle diameter ratio is 2.3, consistent with right heart strain. There is no thoracic aortic aneurysm or dissection. The visualized great vessels appear normal. There is a small pericardial effusion. There is no pericardial thickening. Mediastinum/Nodes: Thyroid appears normal. There is no appreciable thoracic adenopathy. No esophageal lesions are evident. Lungs/Pleura: There is extensive airspace consolidation consistent with pneumonia in the left upper lobe with involvement of portions of the anterior and posterior segments of the left upper lobe. There is more patchy infiltrate in the medial aspect of the superior segment of the right lower lobe as well as in portions of the medial and posterior segments of the left lower lobe. No pleural effusions are evident. Upper Abdomen: There is reflux of contrast into the inferior vena cava and hepatic veins. Visualized upper abdominal structures otherwise appear unremarkable. Musculoskeletal: There is mild thoracic dextroscoliosis. There are no blastic or lytic bone lesions. No chest wall lesions are evident. Review of the MIP images confirms the above findings. IMPRESSION: 1. Extensive bilateral pulmonary embolus with pulmonary emboli  extending from the main pulmonary outflow tracks throughout the upper and lower lobe pulmonary artery systems bilaterally. Positive for acute PE with CT evidence of right heart strain (RV/LV Ratio = 2.3) consistent with at least submassive (intermediate risk) PE. The presence of right heart strain has been associated with an increased risk of morbidity and mortality. Please activate Code PE by paging [phone]. 2. Extensive airspace consolidation consistent with pneumonia in portions of the left upper and left lower lobes. A small amount of infiltrate is noted in the superior segment right lower lobe. A degree of intermingled pulmonary infarct cannot be excluded by CT. 3. Reflux of contrast into the inferior vena cava and hepatic veins, a finding indicative of increased right heart pressure. 4.  Small pericardial effusion. 5.  No thoracic aortic aneurysm or dissection. 6.  No appreciable adenopathy. Critical Value/emergent results were called by telephone at the time of interpretation on 06/03/2019 at 11:55 am to providerSCOTT GOLDSTON , who verbally acknowledged these results. Electronically Signed   By: Bretta Bang III M.D.   On: 06/03/2019 11:55   IR Angiogram Pulmonary Bilateral Selective  Result Date: 06/04/2019 INDICATION: 53 year old male with history of submassive high risk PE, presents for treatment. EXAM: US GUIDED ACCESS RIGHT COMMON FEMORAL VEIN X 2 INITIATION OF BILATERAL CATHETER DIRECTED THROMBOLYSIS PULMONARY ARTERIES COMPARISON:  Same day CT imaging MEDICATIONS: None. ANESTHESIA/SEDATION: Versed 1.5  Mg IV; Fentanyl 75 mcg IV Moderate Sedation Time: 30 min The patient was continuously monitored during the procedure by the interventional radiology nurse under my direct supervision.  FLUOROSCOPY TIME:  Fluoroscopy Time: 6 minutes 24 seconds (222 mGy). COMPLICATIONS: None TECHNIQUE: Informed consent was obtained from the patient and the patient's family following explanation of the  procedure, risks, benefits and alternatives. Specific risks include bleeding, infection, contrast reaction, kidney injury, venous injury, life-threatening hemorrhage including brain hemorrhage, gastrointestinal hemorrhage, epistaxis, need for further surgery, need for further procedure, cardiopulmonary collapse, death. The patient understands, agrees and consents for the procedure. All questions were addressed. Patient is position supine position on the fluoroscopy table. Maximal barrier sterile technique utilized including caps, mask, sterile gowns, sterile gloves, large sterile drape, hand hygiene, and betadine prep. 1% lidocaine used for local anesthesia. Moderate sedation was provided. Ultrasound survey of the right inguinal region was performed with images stored and sent to PACs. A single wall needle was used access the right common femoral vein under ultrasound. With venous blood flow returned, an 035 wire was passed through the needle into the IVC. A 7 French sheath was placed over the wire. The dilator was removed and the sheath was flushed. Subsequently, a single wall needle was used access the right common femoral vein adjacent to the initial puncture. With venous blood flow returned, a 6 [place] vascular sheath was placed over the wire. The dilator was removed and the sheath was flushed. Combination of an angled pigtail catheter and benson wire was then used to select the main pulmonary artery. Wire was removed and a main pulmonary artery pressure transduction was recorded. Glidewire was then advanced into the right pulmonary artery lower lobe branches. The pigtail catheter was removed, and we selected a 90cm length, 15cm infusion length unifuse catheter, placed into the lower lobe branches. Bern catheter and glide wire was then used to navigate into the left-sided pulmonary veins, with selection of lower lobe pulmonary vein. Glidewire was used to advance into a more distal position and the catheter was  placed into lower lobe segments. Catheter was removed and a 15 cm infusion length unifuse catheter was placed. Small contrast infusion confirmed position. Catheter was flushed. The obturator wires were then placed through both the left and right catheters. Sheaths were secured in position.  Final image was stored. The patient tolerated the procedure well and remained hemodynamically stable throughout. No complications were encountered and no significant blood loss was encountered. FINDINGS: Ultrasound survey demonstrates patent right common femoral vein. Main pulmonary artery pressure measures 76/20 (41). Final image demonstrates right-sided catheter directed into lower lobar branches and left sided catheter directed into lower lobar branches. The 7 French (orange) sheaths transmits the right-sided catheter. The 6 French (green) sheath transmits the left-sided pulmonary artery catheter. IMPRESSION: Status post placement of left and right pulmonary artery infusion catheters for initiation of catheter directed thrombolysis. Signed, Yvone Neu. Loreta Ave DO [facility] Radiology PLAN: Patient will be ICU status overnight. Bed rest. Both the right and left catheter will receive 1 mg tPA per hour for 12 hours, for a total dose of 24 mg over 12 hours. Plan for repeat trans duction of catheter pressures after treatment, reassessment, and probable removal of catheters. EVery 6 our blood draw, including CBC, heparin level, and fibrinogen. Electronically Signed   By: Gilmer Mor D.O.   On: 06/04/2019 08:07   IR Angiogram Selective Each Additional Vessel  Result Date: 06/04/2019 INDICATION: 53 year old male with history of submassive high risk PE, presents for treatment. EXAM: US GUIDED ACCESS RIGHT COMMON FEMORAL VEIN X 2 INITIATION OF BILATERAL CATHETER DIRECTED THROMBOLYSIS PULMONARY  ARTERIES COMPARISON:  Same day CT imaging MEDICATIONS: None. ANESTHESIA/SEDATION: Versed  1.5  Mg IV; Fentanyl 75 mcg IV Moderate Sedation Time: 30 min The patient was continuously monitored during the procedure by the interventional radiology nurse under my direct supervision. FLUOROSCOPY TIME:  Fluoroscopy Time: 6 minutes 24 seconds (222 mGy). COMPLICATIONS: None TECHNIQUE: Informed consent was obtained from the patient and the patient's family following explanation of the procedure, risks, benefits and alternatives. Specific risks include bleeding, infection, contrast reaction, kidney injury, venous injury, life-threatening hemorrhage including brain hemorrhage, gastrointestinal hemorrhage, epistaxis, need for further surgery, need for further procedure, cardiopulmonary collapse, death. The patient understands, agrees and consents for the procedure. All questions were addressed. Patient is position supine position on the fluoroscopy table. Maximal barrier sterile technique utilized including caps, mask, sterile gowns, sterile gloves, large sterile drape, hand hygiene, and betadine prep. 1% lidocaine used for local anesthesia. Moderate sedation was provided. Ultrasound survey of the right inguinal region was performed with images stored and sent to PACs. A single wall needle was used access the right common femoral vein under ultrasound. With venous blood flow returned, an 035 wire was passed through the needle into the IVC. A 7 French sheath was placed over the wire. The dilator was removed and the sheath was flushed. Subsequently, a single wall needle was used access the right common femoral vein adjacent to the initial puncture. With venous blood flow returned, a 6 [place] vascular sheath was placed over the wire. The dilator was removed and the sheath was flushed. Combination of an angled pigtail catheter and benson wire was then used to select the main pulmonary artery. Wire was removed and a main pulmonary artery pressure transduction was recorded. Glidewire was then advanced into the right  pulmonary artery lower lobe branches. The pigtail catheter was removed, and we selected a 90cm length, 15cm infusion length unifuse catheter, placed into the lower lobe branches. Bern catheter and glide wire was then used to navigate into the left-sided pulmonary veins, with selection of lower lobe pulmonary vein. Glidewire was used to advance into a more distal position and the catheter was placed into lower lobe segments. Catheter was removed and a 15 cm infusion length unifuse catheter was placed. Small contrast infusion confirmed position. Catheter was flushed. The obturator wires were then placed through both the left and right catheters. Sheaths were secured in position.  Final image was stored. The patient tolerated the procedure well and remained hemodynamically stable throughout. No complications were encountered and no significant blood loss was encountered. FINDINGS: Ultrasound survey demonstrates patent right common femoral vein. Main pulmonary artery pressure measures 76/20 (41). Final image demonstrates right-sided catheter directed into lower lobar branches and left sided catheter directed into lower lobar branches. The 7 French (orange) sheaths transmits the right-sided catheter. The 6 French (green) sheath transmits the left-sided pulmonary artery catheter. IMPRESSION: Status post placement of left and right pulmonary artery infusion catheters for initiation of catheter directed thrombolysis. Signed, Yvone Neu. Loreta Ave DO [facility] Radiology PLAN: Patient will be ICU status overnight. Bed rest. Both the right and left catheter will receive 1 mg tPA per hour for 12 hours, for a total dose of 24 mg over 12 hours. Plan for repeat trans duction of catheter pressures after treatment, reassessment, and probable removal of catheters. EVery 6 our blood draw, including CBC, heparin level, and fibrinogen. Electronically Signed   By: Gilmer Mor D.O.   On:  06/04/2019 08:07  IR Angiogram Selective Each Additional Vessel  Result Date: 06/04/2019 INDICATION: 53 year old male with history of submassive high risk PE, presents for treatment. EXAM: US GUIDED ACCESS RIGHT COMMON FEMORAL VEIN X 2 INITIATION OF BILATERAL CATHETER DIRECTED THROMBOLYSIS PULMONARY ARTERIES COMPARISON:  Same day CT imaging MEDICATIONS: None. ANESTHESIA/SEDATION: Versed 1.5  Mg IV; Fentanyl 75 mcg IV Moderate Sedation Time: 30 min The patient was continuously monitored during the procedure by the interventional radiology nurse under my direct supervision. FLUOROSCOPY TIME:  Fluoroscopy Time: 6 minutes 24 seconds (222 mGy). COMPLICATIONS: None TECHNIQUE: Informed consent was obtained from the patient and the patient's family following explanation of the procedure, risks, benefits and alternatives. Specific risks include bleeding, infection, contrast reaction, kidney injury, venous injury, life-threatening hemorrhage including brain hemorrhage, gastrointestinal hemorrhage, epistaxis, need for further surgery, need for further procedure, cardiopulmonary collapse, death. The patient understands, agrees and consents for the procedure. All questions were addressed. Patient is position supine position on the fluoroscopy table. Maximal barrier sterile technique utilized including caps, mask, sterile gowns, sterile gloves, large sterile drape, hand hygiene, and betadine prep. 1% lidocaine used for local anesthesia. Moderate sedation was provided. Ultrasound survey of the right inguinal region was performed with images stored and sent to PACs. A single wall needle was used access the right common femoral vein under ultrasound. With venous blood flow returned, an 035 wire was passed through the needle into the IVC. A 7 French sheath was placed over the wire. The dilator was removed and the sheath was flushed. Subsequently, a single wall needle was used access the right common femoral vein adjacent to the  initial puncture. With venous blood flow returned, a 6 [place] vascular sheath was placed over the wire. The dilator was removed and the sheath was flushed. Combination of an angled pigtail catheter and benson wire was then used to select the main pulmonary artery. Wire was removed and a main pulmonary artery pressure transduction was recorded. Glidewire was then advanced into the right pulmonary artery lower lobe branches. The pigtail catheter was removed, and we selected a 90cm length, 15cm infusion length unifuse catheter, placed into the lower lobe branches. Bern catheter and glide wire was then used to navigate into the left-sided pulmonary veins, with selection of lower lobe pulmonary vein. Glidewire was used to advance into a more distal position and the catheter was placed into lower lobe segments. Catheter was removed and a 15 cm infusion length unifuse catheter was placed. Small contrast infusion confirmed position. Catheter was flushed. The obturator wires were then placed through both the left and right catheters. Sheaths were secured in position.  Final image was stored. The patient tolerated the procedure well and remained hemodynamically stable throughout. No complications were encountered and no significant blood loss was encountered. FINDINGS: Ultrasound survey demonstrates patent right common femoral vein. Main pulmonary artery pressure measures 76/20 (41). Final image demonstrates right-sided catheter directed into lower lobar branches and left sided catheter directed into lower lobar branches. The 7 French (orange) sheaths transmits the right-sided catheter. The 6 French (green) sheath transmits the left-sided pulmonary artery catheter. IMPRESSION: Status post placement of left and right pulmonary artery infusion catheters for initiation of catheter directed thrombolysis. Signed, Yvone Neu. Loreta Ave DO [facility] Radiology PLAN: Patient will be ICU  status overnight. Bed rest. Both the right and left catheter will receive 1 mg tPA per hour for 12 hours, for a total dose of 24 mg over 12 hours. Plan for  repeat trans duction of catheter pressures after treatment, reassessment, and probable removal of catheters. EVery 6 our blood draw, including CBC, heparin level, and fibrinogen. Electronically Signed   By: Gilmer Mor D.O.   On: 06/04/2019 08:07   IR US Guide Vasc Access Right  Result Date: 06/04/2019 INDICATION: 53 year old male with history of submassive high risk PE, presents for treatment. EXAM: US GUIDED ACCESS RIGHT COMMON FEMORAL VEIN X 2 INITIATION OF BILATERAL CATHETER DIRECTED THROMBOLYSIS PULMONARY ARTERIES COMPARISON:  Same day CT imaging MEDICATIONS: None. ANESTHESIA/SEDATION: Versed 1.5  Mg IV; Fentanyl 75 mcg IV Moderate Sedation Time: 30 min The patient was continuously monitored during the procedure by the interventional radiology nurse under my direct supervision. FLUOROSCOPY TIME:  Fluoroscopy Time: 6 minutes 24 seconds (222 mGy). COMPLICATIONS: None TECHNIQUE: Informed consent was obtained from the patient and the patient's family following explanation of the procedure, risks, benefits and alternatives. Specific risks include bleeding, infection, contrast reaction, kidney injury, venous injury, life-threatening hemorrhage including brain hemorrhage, gastrointestinal hemorrhage, epistaxis, need for further surgery, need for further procedure, cardiopulmonary collapse, death. The patient understands, agrees and consents for the procedure. All questions were addressed. Patient is position supine position on the fluoroscopy table. Maximal barrier sterile technique utilized including caps, mask, sterile gowns, sterile gloves, large sterile drape, hand hygiene, and betadine prep. 1% lidocaine used for local anesthesia. Moderate sedation was provided. Ultrasound survey of the right inguinal region was performed with images stored and sent to  PACs. A single wall needle was used access the right common femoral vein under ultrasound. With venous blood flow returned, an 035 wire was passed through the needle into the IVC. A 7 French sheath was placed over the wire. The dilator was removed and the sheath was flushed. Subsequently, a single wall needle was used access the right common femoral vein adjacent to the initial puncture. With venous blood flow returned, a 6 [place] vascular sheath was placed over the wire. The dilator was removed and the sheath was flushed. Combination of an angled pigtail catheter and benson wire was then used to select the main pulmonary artery. Wire was removed and a main pulmonary artery pressure transduction was recorded. Glidewire was then advanced into the right pulmonary artery lower lobe branches. The pigtail catheter was removed, and we selected a 90cm length, 15cm infusion length unifuse catheter, placed into the lower lobe branches. Bern catheter and glide wire was then used to navigate into the left-sided pulmonary veins, with selection of lower lobe pulmonary vein. Glidewire was used to advance into a more distal position and the catheter was placed into lower lobe segments. Catheter was removed and a 15 cm infusion length unifuse catheter was placed. Small contrast infusion confirmed position. Catheter was flushed. The obturator wires were then placed through both the left and right catheters. Sheaths were secured in position.  Final image was stored. The patient tolerated the procedure well and remained hemodynamically stable throughout. No complications were encountered and no significant blood loss was encountered. FINDINGS: Ultrasound survey demonstrates patent right common femoral vein. Main pulmonary artery pressure measures 76/20 (41). Final image demonstrates right-sided catheter directed into lower lobar branches and left sided catheter directed into lower lobar branches. The 7 French (orange) sheaths  transmits the right-sided catheter. The 6 French (green) sheath transmits the left-sided pulmonary artery catheter. IMPRESSION: Status post placement of left and right pulmonary artery infusion catheters for initiation of catheter directed thrombolysis. Signed, Yvone Neu. Loreta Ave, DO [facility] Radiology PLAN: Patient will be ICU status overnight. Bed rest. Both the right and left catheter will receive 1 mg tPA per hour for 12 hours, for a total dose of 24 mg over 12 hours. Plan for repeat trans duction of catheter pressures after treatment, reassessment, and probable removal of catheters. EVery 6 our blood draw, including CBC, heparin level, and fibrinogen. Electronically Signed   By: Gilmer Mor D.O.   On: 06/04/2019 08:07   DG Chest Portable 1 View  Result Date: 06/03/2019 CLINICAL DATA:  Cough and shortness of breath EXAM: PORTABLE CHEST 1 VIEW COMPARISON:  None. FINDINGS: There is an area of ill-defined increased opacity in the left upper lobe. Lungs elsewhere are clear. Heart is borderline enlarged with pulmonary vascularity normal. No adenopathy. No bone lesions. IMPRESSION: Ill-defined area of opacity in the left upper lobe, suspicious for early pneumonia. Lungs elsewhere clear. Borderline cardiac enlargement. Electronically Signed   By: Bretta Bang III M.D.   On: 06/03/2019 10:12   ECHOCARDIOGRAM COMPLETE  Result Date: 06/03/2019   ECHOCARDIOGRAM REPORT   Patient Name:   Ricky Bentley Date of Exam: 06/03/2019 Medical Rec #:  [id number]        Height:       70.0 in Accession #:    [id number]       Weight:       230.0 lb Date of Birth:  1966/03/05        BSA:          2.22 m Patient Age:    53 years         BP:           156/113 mmHg Patient Gender: M                HR:           103 bpm. Exam Location:  Inpatient Procedure: 2D Echo, Cardiac Doppler and Color Doppler Indications:    Pulmonary embolus 415.19  History:        Patient has no  prior history of Echocardiogram examinations.                 Risk Factors:Non-Smoker. Pulmonary embolus.  Sonographer:    Tonia Ghent RDCS Referring Phys: [id number] DAVID MANUEL ORTIZ IMPRESSIONS  1. Left ventricular ejection fraction, by visual estimation, is 55 to 60%. The left ventricle has hyperdynamic function. There is mildly increased left ventricular hypertrophy.  2. Left ventricular diastolic parameters are consistent with Grade I diastolic dysfunction (impaired relaxation).  3. Right ventricular volume and pressure overload.  4. The left ventricle demonstrates regional wall motion abnormalities.  5. Global right ventricle has moderately reduced systolic function.The right ventricular size is moderately enlarged. No increase in right ventricular wall thickness.  6. Left atrial size was normal.  7. Right atrial size was normal.  8. Small pericardial effusion.  9. The pericardial effusion is circumferential. 10. The mitral valve is grossly normal. Trivial mitral valve regurgitation. 11. The tricuspid valve is grossly normal. Tricuspid valve regurgitation is trivial. 12. The aortic valve is tricuspid. Aortic valve regurgitation is not visualized. 13. The pulmonic valve was grossly normal. Pulmonic valve regurgitation is trivial. 14. Severely elevated pulmonary artery systolic pressure. 15. The inferior vena cava is dilated in size with <50% respiratory variability, suggesting right atrial pressure of 15 mmHg. 16. Findings consistent with RV strain FINDINGS  Left Ventricle: Left ventricular ejection fraction, by visual estimation, is 55 to 60%. The left  ventricle has hyperdynamic function. The left ventricle demonstrates regional wall motion abnormalities. There is mildly increased left ventricular hypertrophy. The interventricular septum is flattened in systole and diastole, consistent with right ventricular pressure and volume overload. Left ventricular diastolic parameters are consistent with Grade I  diastolic dysfunction (impaired relaxation). Normal left atrial pressure. Right Ventricle: The right ventricular size is moderately enlarged. No increase in right ventricular wall thickness. Global RV systolic function is has moderately reduced systolic function. The tricuspid regurgitant velocity is 3.95 m/s, and with an assumed right atrial pressure of 15 mmHg, the estimated right ventricular systolic pressure is severely elevated at 77.4 mmHg. Left Atrium: Left atrial size was normal in size. Right Atrium: Right atrial size was normal in size Pericardium: A small pericardial effusion is present. The pericardial effusion is circumferential. Mitral Valve: The mitral valve is grossly normal. Trivial mitral valve regurgitation. Tricuspid Valve: The tricuspid valve is grossly normal. Tricuspid valve regurgitation is trivial. Aortic Valve: The aortic valve is tricuspid. Aortic valve regurgitation is not visualized. Pulmonic Valve: The pulmonic valve was grossly normal. Pulmonic valve regurgitation is trivial. Pulmonic regurgitation is trivial. Aorta: The aortic root and ascending aorta are structurally normal, with no evidence of dilitation. Venous: The inferior vena cava is dilated in size with less than 50% respiratory variability, suggesting right atrial pressure of 15 mmHg. IAS/Shunts: No atrial level shunt detected by color flow Doppler.  LEFT VENTRICLE PLAX 2D LVIDd:         3.90 cm       Diastology LVIDs:         2.50 cm       LV e' lateral:   11.50 cm/s LV PW:         1.00 cm       LV E/e' lateral: 3.2 LV IVS:        1.00 cm       LV e' medial:    7.07 cm/s LVOT diam:     2.10 cm       LV E/e' medial:  5.3 LV SV:         44 ml LV SV Index:   18.92 LVOT Area:     3.46 cm  LV Volumes (MOD) LV area d, A2C:    27.20 cm LV area d, A4C:    28.90 cm LV area s, A2C:    16.50 cm LV area s, A4C:    19.60 cm LV major d, A2C:   8.14 cm LV major d, A4C:   7.80 cm LV major s, A2C:   6.89 cm LV major s, A4C:   6.42 cm LV  vol d, MOD A2C: 77.7 ml LV vol d, MOD A4C: 95.9 ml LV vol s, MOD A2C: 35.0 ml LV vol s, MOD A4C: 50.7 ml LV SV MOD A2C:     42.7 ml LV SV MOD A4C:     95.9 ml LV SV MOD BP:      44.8 ml RIGHT VENTRICLE RV S prime:     9.14 cm/s TAPSE (M-mode): 1.6 cm LEFT ATRIUM             Index       RIGHT ATRIUM           Index LA diam:        2.90 cm 1.31 cm/m  RA Area:     21.50 cm LA Vol (A2C):   25.3 ml 11.42 ml/m RA Volume:   67.80 ml  30.61  ml/m LA Vol (A4C):   29.1 ml 13.14 ml/m LA Biplane Vol: 28.1 ml 12.69 ml/m  AORTIC VALVE LVOT Vmax:   69.60 cm/s LVOT Vmean:  50.600 cm/s LVOT VTI:    0.112 m  AORTA Ao Root diam: 3.20 cm MITRAL VALVE                        TRICUSPID VALVE MV Area (PHT): 2.91 cm             TR Peak grad:   62.4 mmHg MV PHT:        75.69 msec           TR Vmax:        395.00 cm/s MV Decel Time: 261 msec MV E velocity: 37.20 cm/s 103 cm/s  SHUNTS MV A velocity: 44.50 cm/s 70.3 cm/s Systemic VTI:  0.11 m MV E/A ratio:  0.84       1.5       Systemic Diam: 2.10 cm  Lyman Bishop MD Electronically signed by Lyman Bishop MD Signature Date/Time: 06/03/2019/2:36:09 PM    Final    VAS [place] LOWER EXTREMITY VENOUS (DVT)  Result Date: 06/05/2019  Lower Venous Study Indications: Pulmonary embolism.  Risk Factors: Confirmed PE. Anticoagulation: Heparin. Limitations: Poor ultrasound/tissue interface. Comparison Study: No prior studies. Performing Technologist: Oliver Hum RVT  Examination Guidelines: A complete evaluation includes B-mode imaging, spectral Doppler, color Doppler, and power Doppler as needed of all accessible portions of each vessel. Bilateral testing is considered an integral part of a complete examination. Limited examinations for reoccurring indications may be performed as noted.  +---------+---------------+---------+-----------+----------+--------------+ RIGHT    CompressibilityPhasicitySpontaneityPropertiesThrombus Aging  +---------+---------------+---------+-----------+----------+--------------+ CFV      Full           Yes      Yes                                 +---------+---------------+---------+-----------+----------+--------------+ SFJ      Full                                                        +---------+---------------+---------+-----------+----------+--------------+ FV Prox  Full                                                        +---------+---------------+---------+-----------+----------+--------------+ FV Mid   Full                                                        +---------+---------------+---------+-----------+----------+--------------+ FV DistalNone           No       No                   Acute          +---------+---------------+---------+-----------+----------+--------------+ PFV      Full                                                        +---------+---------------+---------+-----------+----------+--------------+  POP      None           No       No                   Acute          +---------+---------------+---------+-----------+----------+--------------+ PTV      Full                                                        +---------+---------------+---------+-----------+----------+--------------+ PERO     Partial                                      Acute          +---------+---------------+---------+-----------+----------+--------------+   +---------+---------------+---------+-----------+----------+--------------+ LEFT     CompressibilityPhasicitySpontaneityPropertiesThrombus Aging +---------+---------------+---------+-----------+----------+--------------+ CFV      Full           Yes      Yes                                 +---------+---------------+---------+-----------+----------+--------------+ SFJ      Full                                                         +---------+---------------+---------+-----------+----------+--------------+ FV Prox  Full                                                        +---------+---------------+---------+-----------+----------+--------------+ FV Mid   Full                                                        +---------+---------------+---------+-----------+----------+--------------+ FV DistalFull                                                        +---------+---------------+---------+-----------+----------+--------------+ PFV      Full                                                        +---------+---------------+---------+-----------+----------+--------------+ POP      Full           Yes      Yes                                 +---------+---------------+---------+-----------+----------+--------------+  PTV      Full                                                        +---------+---------------+---------+-----------+----------+--------------+ PERO     Full                                                        +---------+---------------+---------+-----------+----------+--------------+     Summary: Right: Findings consistent with acute deep vein thrombosis involving the right femoral vein, right popliteal vein, and right peroneal veins. No cystic structure found in the popliteal fossa. Left: There is no evidence of deep vein thrombosis in the lower extremity. No cystic structure found in the popliteal fossa.  *See table(s) above for measurements and observations. Electronically signed by Coral Else MD on 06/05/2019 at 4:43:07 PM.    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 EXAM: US GUIDED ACCESS RIGHT COMMON FEMORAL VEIN X 2 INITIATION OF BILATERAL CATHETER DIRECTED THROMBOLYSIS PULMONARY ARTERIES COMPARISON:  Same day CT imaging MEDICATIONS:  None. ANESTHESIA/SEDATION: Versed 1.5  Mg IV; Fentanyl 75 mcg IV Moderate Sedation Time: 30 min The patient was continuously monitored during the procedure by the interventional radiology nurse under my direct supervision. FLUOROSCOPY TIME:  Fluoroscopy Time: 6 minutes 24 seconds (222 mGy). COMPLICATIONS: None TECHNIQUE: Informed consent was obtained from the patient and the patient's family following explanation of the procedure, risks, benefits and alternatives. Specific risks include bleeding, infection, contrast reaction, kidney injury, venous injury, life-threatening hemorrhage including brain hemorrhage, gastrointestinal hemorrhage, epistaxis, need for further surgery, need for further procedure, cardiopulmonary collapse, death. The patient understands, agrees and consents for the procedure. All questions were addressed. Patient is position supine position on the fluoroscopy table. Maximal barrier sterile technique utilized including caps, mask, sterile gowns, sterile gloves, large sterile drape, hand hygiene, and betadine prep. 1% lidocaine used for local anesthesia. Moderate sedation was provided. Ultrasound survey of the right inguinal region was performed with images stored and sent to PACs. A single wall needle was used access the right common femoral vein under ultrasound. With venous blood flow returned, an 035 wire was passed through the needle into the IVC. A 7 French sheath was placed over the wire. The dilator was removed and the sheath was flushed. Subsequently, a single wall needle was used access the right common femoral vein adjacent to the initial puncture. With venous blood flow returned, a 6 [place] vascular sheath was placed over the wire. The dilator was removed and the sheath was flushed. Combination of an angled pigtail catheter and benson wire was then used to select the main pulmonary artery. Wire was removed and a main pulmonary artery pressure transduction was recorded. Glidewire was  then advanced into the right pulmonary artery lower lobe branches. The pigtail catheter was removed, and we selected a 90cm length, 15cm infusion length unifuse catheter, placed into the lower lobe branches. Bern catheter and glide wire was then used to navigate into the left-sided pulmonary veins, with selection of lower lobe pulmonary vein. Glidewire was  used to advance into a more distal position and the catheter was placed into lower lobe segments. Catheter was removed and a 15 cm infusion length unifuse catheter was placed. Small contrast infusion confirmed position. Catheter was flushed. The obturator wires were then placed through both the left and right catheters. Sheaths were secured in position.  Final image was stored. The patient tolerated the procedure well and remained hemodynamically stable throughout. No complications were encountered and no significant blood loss was encountered. FINDINGS: Ultrasound survey demonstrates patent right common femoral vein. Main pulmonary artery pressure measures 76/20 (41). Final image demonstrates right-sided catheter directed into lower lobar branches and left sided catheter directed into lower lobar branches. The 7 French (orange) sheaths transmits the right-sided catheter. The 6 French (green) sheath transmits the left-sided pulmonary artery catheter. IMPRESSION: Status post placement of left and right pulmonary artery infusion catheters for initiation of catheter directed thrombolysis. Signed, Yvone Neu. Loreta Ave DO [facility] Radiology PLAN: Patient will be ICU status overnight. Bed rest. Both the right and left catheter will receive 1 mg tPA per hour for 12 hours, for a total dose of 24 mg over 12 hours. Plan for repeat trans duction of catheter pressures after treatment, reassessment, and probable removal of catheters. EVery 6 our blood draw, including CBC, heparin level, and fibrinogen. Electronically Signed   By:  Gilmer Mor D.O.   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 EXAM: US GUIDED ACCESS RIGHT COMMON FEMORAL VEIN X 2 INITIATION OF BILATERAL CATHETER DIRECTED THROMBOLYSIS PULMONARY ARTERIES COMPARISON:  Same day CT imaging MEDICATIONS: None. ANESTHESIA/SEDATION: Versed 1.5  Mg IV; Fentanyl 75 mcg IV Moderate Sedation Time: 30 min The patient was continuously monitored during the procedure by the interventional radiology nurse under my direct supervision. FLUOROSCOPY TIME:  Fluoroscopy Time: 6 minutes 24 seconds (222 mGy). COMPLICATIONS: None TECHNIQUE: Informed consent was obtained from the patient and the patient's family following explanation of the procedure, risks, benefits and alternatives. Specific risks include bleeding, infection, contrast reaction, kidney injury, venous injury, life-threatening hemorrhage including brain hemorrhage, gastrointestinal hemorrhage, epistaxis, need for further surgery, need for further procedure, cardiopulmonary collapse, death. The patient understands, agrees and consents for the procedure. All questions were addressed. Patient is position supine position on the fluoroscopy table. Maximal barrier sterile technique utilized including caps, mask, sterile gowns, sterile gloves, large sterile drape, hand hygiene, and betadine prep. 1% lidocaine used for local anesthesia. Moderate sedation was provided. Ultrasound survey of the right inguinal region was performed with images stored and sent to PACs. A single wall needle was used access the right common femoral vein under ultrasound. With venous blood flow returned, an 035 wire was passed through the needle into the IVC. A 7 French sheath was placed over the wire. The dilator was removed and the sheath was flushed. Subsequently, a single wall needle was used access the right common  femoral vein adjacent to the initial puncture. With venous blood flow returned, a 6 [place] vascular sheath was placed over the wire. The dilator was removed and the sheath was flushed. Combination of an angled pigtail catheter and benson wire was then used to select the main pulmonary artery. Wire was removed and a main pulmonary artery pressure transduction was recorded. Glidewire was then advanced into the right pulmonary artery lower lobe branches. The pigtail catheter was removed,  and we selected a 90cm length, 15cm infusion length unifuse catheter, placed into the lower lobe branches. Bern catheter and glide wire was then used to navigate into the left-sided pulmonary veins, with selection of lower lobe pulmonary vein. Glidewire was used to advance into a more distal position and the catheter was placed into lower lobe segments. Catheter was removed and a 15 cm infusion length unifuse catheter was placed. Small contrast infusion confirmed position. Catheter was flushed. The obturator wires were then placed through both the left and right catheters. Sheaths were secured in position.  Final image was stored. The patient tolerated the procedure well and remained hemodynamically stable throughout. No complications were encountered and no significant blood loss was encountered. FINDINGS: Ultrasound survey demonstrates patent right common femoral vein. Main pulmonary artery pressure measures 76/20 (41). Final image demonstrates right-sided catheter directed into lower lobar branches and left sided catheter directed into lower lobar branches. The 7 French (orange) sheaths transmits the right-sided catheter. The 6 French (green) sheath transmits the left-sided pulmonary artery catheter. IMPRESSION: Status post placement of left and right pulmonary artery infusion catheters for initiation of catheter directed thrombolysis. Signed, Yvone Neu. Loreta Ave DO [facility] Radiology  PLAN: Patient will be ICU status overnight. Bed rest. Both the right and left catheter will receive 1 mg tPA per hour for 12 hours, for a total dose of 24 mg over 12 hours. Plan for repeat trans duction of catheter pressures after treatment, reassessment, and probable removal of catheters. EVery 6 our blood draw, including CBC, heparin level, and fibrinogen. Electronically Signed   By: Gilmer Mor D.O.   On: 06/04/2019 08:07   IR THROMB F/U EVAL ART/VEN FINAL DAY (MS)  Result Date: 06/04/2019 INDICATION: Acute sub massive bilateral pulmonary emboli, hypertension, obesity EXAM: PE thrombo lysis protocol, final day COMPARISON:  06/03/2019 MEDICATIONS: None. ANESTHESIA/SEDATION: Moderate Sedation Time:  None. The patient was continuously monitored during the procedure by the interventional radiology nurse under my direct supervision. FLUOROSCOPY TIME:  Fluoroscopy Time: None. COMPLICATIONS: None immediate. TECHNIQUE: At the bedside, a central pulmonary artery pressure was obtained through 1 of the infusion catheters. Following this, both infusion catheters and right femoral access sheath were removed. Hemostasis obtained with compression. Sterile dressing applied. No immediate complication. Patient tolerated the procedure well. FINDINGS: Post PE lysis protocol PA pressure: 50/16 (28) This is decreased from a pretreatment pressure of 76/20 (41) IMPRESSION: Successful completion of the PE thrombo lysis protocol. Reduction in the central pulmonary artery pressure as above. Electronically Signed   By: Judie Petit.  Shick M.D.   On: 06/04/2019 11:03    Subjective: Eager to go home  Discharge Exam: Vitals:   06/07/19 0714 06/07/19 1233  BP: 127/87 (!) 141/89  Pulse: 74 77  Resp: 17 20  Temp: (!) 97.5 F (36.4 C) 97.7 F (36.5 C)  SpO2: 93% 94%   Vitals:   06/06/19 1334 06/06/19 2127 06/07/19 0714 06/07/19 1233  BP: 127/87 (!) 155/94 127/87 (!) 141/89  Pulse: 92 79 74 77  Resp: Temp: 98.5 F  (36.9 C) 98.3 F (36.8 C) (!) 97.5 F (36.4 C) 97.7 F (36.5 C)  TempSrc: Oral Oral Oral Oral  SpO2: 93% 95% 93% 94%  Weight:      Height:        General: Pt is alert, awake, not in acute distress Cardiovascular: RRR, S1/S2 +, no rubs, no gallops Respiratory: CTA bilaterally, no wheezing, no rhonchi Abdominal: Soft,  NT, ND, bowel sounds + Extremities: no edema, no cyanosis   The results of significant diagnostics from this hospitalization (including imaging, microbiology, ancillary and laboratory) are listed below for reference.     Microbiology: Recent Results (from the past 240 hour(s))  SARS CORONAVIRUS 2 (TAT 6-24 HRS) Nasopharyngeal Nasopharyngeal Swab     Status: None   Collection Time: 06/03/19 12:15 PM   Specimen: Nasopharyngeal Swab  Result Value Ref Range Status   SARS Coronavirus 2 NEGATIVE NEGATIVE Final    Comment: (NOTE) SARS-CoV-2 target nucleic acids are NOT DETECTED. The SARS-CoV-2 RNA is generally detectable in upper and lower respiratory specimens during the acute phase of infection. Negative results do not preclude SARS-CoV-2 infection, do not rule out co-infections with other pathogens, and should not be used as the sole basis for treatment or other patient management decisions. Negative results must be combined with clinical observations, patient history, and epidemiological information. The expected result is Negative. Fact Sheet for Patients: HairSlick.nohttps://www.fda.gov/media/138098/download Fact Sheet for Healthcare Providers: quierodirigir.comhttps://www.fda.gov/media/138095/download This test is not yet approved or cleared by the [place] FDA and  has been authorized for detection and/or diagnosis of SARS-CoV-2 by FDA under an Emergency Use Authorization (EUA). This EUA will remain  in effect (meaning this test can be used) for the duration of the COVID-19 declaration under Section 56 4(b)(1) of the Act, 21 U.S.C. section 360bbb-3(b)(1), unless the authorization  is terminated or revoked sooner. Performed at [facility], [address] [place], [place] [address]   Culture, blood (routine x 2)     Status: None (Preliminary result)   Collection Time: 06/03/19 12:37 PM   Specimen: BLOOD  Result Value Ref Range Status   Specimen Description   Final    BLOOD BLOOD RIGHT FOREARM Performed at [facility], [address] [place], [place] [address]    Special Requests   Final    BOTTLES DRAWN AEROBIC AND ANAEROBIC Blood Culture adequate volume Performed at [facility], [address] [place], [place] [address]    Culture   Final    NO GROWTH 4 DAYS Performed at [facility], [address] [place], [place] [address]    Report Status PENDING  Incomplete  Culture, blood (routine x 2)     Status: None (Preliminary result)   Collection Time: 06/03/19 12:37 PM   Specimen: BLOOD  Result Value Ref Range Status   Specimen Description   Final    BLOOD RIGHT ANTECUBITAL Performed at [facility], [address] [place], [place] [address]    Special Requests   Final    BOTTLES DRAWN AEROBIC AND ANAEROBIC Blood Culture results may not be optimal due to an excessive volume of blood received in culture bottles Performed at [facility], [address] [place], [place] [id number]    Culture   Final    NO GROWTH 4 DAYS Performed at [facility], [address] [place], [place] [address]    Report Status PENDING  Incomplete  Culture, sputum-assessment     Status: None   Collection Time: 06/03/19  2:04 PM   Specimen: Expectorated Sputum  Result Value Ref [place] Status   Specimen Description EXPECTORATED SPUTUM  Final   Special Requests NONE  Final   Sputum evaluation   Final    THIS SPECIMEN IS ACCEPTABLE FOR SPUTUM CULTURE Performed at [facility], [address] [place], [place] [address]  Report Status 06/03/2019 FINAL  Final   Culture, respiratory     Status: None   Collection Time: 06/03/19  2:04 PM  Result Value Ref [place] Status   Specimen Description   Final    EXPECTORATED SPUTUM Performed at [facility], [address], [place], [place] [address]    Special Requests   Final    NONE Reflexed from [id number] Performed at [facility], [address] [place], [place] [address]    Gram Stain   Final    MODERATE WBC PRESENT,BOTH PMN AND MONONUCLEAR FEW GRAM NEGATIVE RODS FEW GRAM NEGATIVE COCCOBACILLI    Culture   Final    Consistent with normal respiratory flora. Performed at [facility], [address] [place], [place] [address]    Report Status 06/06/2019 FINAL  Final  MRSA PCR Screening     Status: None   Collection Time: 06/03/19  4:13 PM   Specimen: Nasal Mucosa; Nasopharyngeal  Result Value Ref [place] Status   MRSA by PCR NEGATIVE NEGATIVE Final    Comment:        The GeneXpert MRSA Assay (FDA approved for NASAL specimens only), is one component of a comprehensive MRSA colonization surveillance program. It is not intended to diagnose MRSA infection nor to guide or monitor treatment for MRSA infections. Performed at [facility], [address], [place], [place] [address]      Labs: BNP (last 3 results) Recent Labs    06/03/19 1028  BNP 589.8*   Basic Metabolic Panel: Recent Labs  Lab 06/03/19 1028 06/03/19 1215 06/04/19 0107  NA 137  --  138  K 4.0  --  3.8  CL 103  --  105  CO2 22  --  21*  GLUCOSE 116*  --  117*  BUN 25*  --  20  CREATININE 1.69*  --  1.45*  CALCIUM 9.1  --  8.4*  MG  --  2.1  --    Liver Function Tests: Recent Labs  Lab 06/04/19 0107  AST 22  ALT 43  ALKPHOS 49  BILITOT 1.2  PROT 6.4*  ALBUMIN 3.4*   No results for input(s): LIPASE, AMYLASE in the last 168 hours. No results for input(s): AMMONIA in the last 168 hours. CBC: Recent Labs  Lab 06/03/19 1028 06/04/19 0815  06/04/19 1431 06/05/19 0124 06/06/19 0450 06/07/19 0436  WBC 9.7 8.6 8.5 9.0 8.0 7.2  NEUTROABS 6.9  --   --   --   --   --   HGB 16.6 14.5 15.0 14.4 14.3 14.6  HCT 49.5 44.0 45.1 44.1 44.1 44.4  MCV 89.7 89.6 90.2 90.6 90.6 89.7  PLT 291 199 213 209 247 269   Cardiac Enzymes: No results for input(s): CKTOTAL, CKMB, CKMBINDEX, TROPONINI in the last 168 hours. BNP: Invalid input(s): POCBNP CBG: No results for input(s): GLUCAP in the last 168 hours. D-Dimer No results for input(s): DDIMER in the last 72 hours. Hgb A1c No results for input(s): HGBA1C in the last 72 hours. Lipid Profile No results for input(s): CHOL, HDL, LDLCALC, TRIG, CHOLHDL, LDLDIRECT in the last 72 hours. Thyroid function studies No results for input(s): TSH, T4TOTAL, T3FREE, THYROIDAB in the last 72 hours.  Invalid input(s): FREET3 Anemia work up No results for input(s): VITAMINB12, FOLATE, FERRITIN, TIBC, IRON, RETICCTPCT in the last 72 hours. Urinalysis    Component Value Date/Time   COLORURINE Yellow 04/26/2013 0850   APPEARANCEUR Clear 04/26/2013  0850   LABSPEC 1.025 04/26/2013 0850   PHURINE 6.0 04/26/2013 0850   GLUCOSEU Negative 04/26/2013 0850   HGBUR Negative 04/26/2013 0850   BILIRUBINUR Negative 04/26/2013 0850   KETONESUR Negative 04/26/2013 0850   UROBILINOGEN 0.2 mg/dL 81/19/1478 2956   NITRITE Negative 04/26/2013 0850   LEUKOCYTESUR Negative 04/26/2013 0850   Sepsis Labs Invalid input(s): PROCALCITONIN,  WBC,  LACTICIDVEN Microbiology Recent Results (from the past 240 hour(s))  SARS CORONAVIRUS 2 (TAT 6-24 HRS) Nasopharyngeal Nasopharyngeal Swab     Status: None   Collection Time: 06/03/19 12:15 PM   Specimen: Nasopharyngeal Swab  Result Value Ref [place] Status   SARS Coronavirus 2 NEGATIVE NEGATIVE Final    Comment: (NOTE) SARS-CoV-2 target nucleic acids are NOT DETECTED. The SARS-CoV-2 RNA is generally detectable in upper and lower respiratory specimens during the acute phase  of infection. Negative results do not preclude SARS-CoV-2 infection, do not rule out co-infections with other pathogens, and should not be used as the sole basis for treatment or other patient management decisions. Negative results must be combined with clinical observations, patient history, and epidemiological information. The expected result is Negative. Fact Sheet for Patients: HairSlick.no Fact Sheet for Healthcare Providers: quierodirigir.com This test is not yet approved or cleared by the [place] FDA and  has been authorized for detection and/or diagnosis of SARS-CoV-2 by FDA under an Emergency Use Authorization (EUA). This EUA will remain  in effect (meaning this test can be used) for the duration of the COVID-19 declaration under Section 56 4(b)(1) of the Act, 21 U.S.C. section 360bbb-3(b)(1), unless the authorization is terminated or revoked sooner. Performed at [facility], [address], [place], [place] [address]   Culture, blood (routine x 2)     Status: None (Preliminary result)   Collection Time: 06/03/19 12:37 PM   Specimen: BLOOD  Result Value Ref [place] Status   Specimen Description   Final    BLOOD BLOOD RIGHT FOREARM Performed at [facility], [address], [place], [place] [address]    Special Requests   Final    BOTTLES DRAWN AEROBIC AND ANAEROBIC Blood Culture adequate volume Performed at [facility], [address], [place], [place] [address]    Culture   Final    NO GROWTH 4 DAYS Performed at [facility], [address] [place], [place] [address]    Report Status PENDING  Incomplete  Culture, blood (routine x 2)     Status: None (Preliminary result)   Collection Time: 06/03/19 12:37 PM   Specimen: BLOOD  Result Value Ref [place] Status   Specimen Description   Final    BLOOD RIGHT ANTECUBITAL Performed at [facility],  [address], [place], [place] [address]    Special Requests   Final    BOTTLES DRAWN AEROBIC AND ANAEROBIC Blood Culture results may not be optimal due to an excessive volume of blood received in culture bottles Performed at [facility], [address], [place], [place] [address]    Culture   Final    NO GROWTH 4 DAYS Performed at [facility], [address] [place], [place] [address]    Report Status PENDING  Incomplete  Culture, sputum-assessment     Status: None   Collection Time: 06/03/19  2:04 PM   Specimen: Expectorated Sputum  Result Value Ref [place] Status   Specimen Description EXPECTORATED SPUTUM  Final   Special Requests NONE  Final   Sputum evaluation   Final    THIS SPECIMEN IS ACCEPTABLE FOR SPUTUM CULTURE Performed at [facility], [address] [place], [place] [address]    Report Status 06/03/2019 FINAL  Final  Culture, respiratory     Status: None   Collection Time: 06/03/19  2:04 PM  Result Value Ref [place] Status   Specimen Description   Final    EXPECTORATED SPUTUM Performed at [facility], [address], [place], [place] [address]    Special Requests   Final    NONE Reflexed from [id number] Performed at [facility], [address] [place], [place] [address]    Gram Stain   Final    MODERATE WBC PRESENT,BOTH PMN AND MONONUCLEAR FEW GRAM NEGATIVE RODS FEW GRAM NEGATIVE COCCOBACILLI    Culture   Final    Consistent with normal respiratory flora. Performed at [facility], [address], [place], [place] [address]    Report Status 06/06/2019 FINAL  Final  MRSA PCR Screening     Status: None   Collection Time: 06/03/19  4:13 PM   Specimen: Nasal Mucosa; Nasopharyngeal  Result Value Ref [place] Status   MRSA by PCR NEGATIVE NEGATIVE Final    Comment:        The GeneXpert MRSA Assay (FDA approved for NASAL specimens only), is one component of a comprehensive  MRSA colonization surveillance program. It is not intended to diagnose MRSA infection nor to guide or monitor treatment for MRSA infections. Performed at [facility], [address] [place], [place] [address]    Time spent: 30 min  SIGNED:   Rickey Barbara, MD  [facility] 06/07/2019, 4:02 PM  If 7PM-7AM, please contact night-coverage

## 2019-06-08 ENCOUNTER — Telehealth: Payer: Self-pay | Admitting: Hematology

## 2019-06-08 LAB — CULTURE, BLOOD (ROUTINE X 2)
Culture: NO GROWTH
Culture: NO GROWTH
Special Requests: ADEQUATE

## 2019-06-08 NOTE — Telephone Encounter (Signed)
A new hem appt has been scheduled for Ricky Bentley to see Dr. Irene Limbo on 1/6 at 10am. Pt aware to arrive 15 minutes early.

## 2019-06-09 NOTE — Telephone Encounter (Signed)
Created in error

## 2019-06-22 NOTE — Progress Notes (Signed)
HEMATOLOGY/ONCOLOGY CONSULTATION NOTE  Date of Service: 06/23/2019  Patient Care Team: Renford Dills, MD as PCP - General (Internal Medicine)  CHIEF COMPLAINTS/PURPOSE OF CONSULTATION:  B/L PE and DVT  HISTORY OF PRESENTING ILLNESS:   Ricky Bentley is a wonderful 54 y.o. male who has been referred to [place] after a hospital visit for evaluation and management of DVT and b/l Pulmonary Embolism. The pt reports that he is doing well overall.   The pt reports pt had a history of high blood pressure but began working out and eating healthier recently. Pt was using HCTZ and began receiving testosterone injections about 6 months before DVT/PE. His testosterone injections were given by his PCP, Dr. Nehemiah Settle every month. His last injection was near the end of November. He began receiving the injections due to a lack of energy and sexual drive. He did feel an increase in energy but believes that this was more likely due to beginning a workout regimen and eating better foods. Pt's only previous surgery was a b/l Achilles Tendon Surgery. Pt was otherwise in great health.   He was feeling normal up until 2 weeks before his hospital admittance. Pt began to notice some SOB walking up stairs and around his home. He was concerned that he had Covid-19 due to respiratory symptoms but his test came back negative. Pt was monitoring his oxygen saturation with a pulse oximeter at home and his levels were fine while resting but did drop with activity. He began to have a cough the Wednesday night before his hospital admittance. He was admitted to the hospital on 06/03/2019. After his pulmonary artery catheter-directed thrombolysis pt felt immediately better and has been improving since. Pt has gotten back to walking with his dog but is not doing more strenuous exercise at this time. He denies any pain or swelling in his right leg. Pt did not do any long-term travelling (>4 hours ) prior to his DVT/PE.   Pt does not smoke  cigarettes and does not drink much alcohol. Pt had no previous blood clots and denies blood clotting or bleeding disorders in his family. His brother has also had a blood clot but has many other health problems that could have contributed to this. He has had no known kidney disorders.   Of note prior to the patient's visit today, pt has had VAS [place] Lower Extremity Venous Bilat (DVT) completed on 06/04/2019 with results revealing "Right: Findings consistent with acute deep vein thrombosis involving the right femoral vein, right popliteal vein, and right peroneal veins. No cystic structure found in the popliteal fossa. Left: There is no evidence of deep vein thrombosis in the lower extremity. No cystic structure found in the popliteal fossa."   Pt has had CT Angio Chest PE ([id number]) completed on 06/03/2019 with results revealing "1. Extensive bilateral pulmonary embolus with pulmonary emboli extending from the main pulmonary outflow tracks throughout the upper and lower lobe pulmonary artery systems bilaterally. Positive for acute PE with CT evidence of right heart strain (RV/LV Ratio = 2.3) consistent with at least submassive (intermediate risk) PE. The presence of right heart strain has been associated with an increased risk of morbidity and mortality. Please activate Code PE by paging [phone]. 2. Extensive airspace consolidation consistent with pneumonia in portions of the left upper and left lower lobes. A small amount of infiltrate is noted in the superior segment right lower lobe. A degree of intermingled pulmonary infarct cannot be excluded by CT. 3. Reflux of  contrast into the inferior vena cava and hepatic veins, a finding indicative of increased right heart pressure. 4.  Small pericardial effusion. 5.  No thoracic aortic aneurysm or dissection. 6.  No appreciable adenopathy."  Most recent lab results (06/07/2019) of CBC is as follows: all values are WNL.  On review of systems, pt denies  unexpected weight loss, bowel movement changes, fevers, chills, night sweats, SOB, cough, abdominal pain, leg swelling/pain, thigh pain and any other symptoms.   On PMHx the pt reports HTN, Achilles Tendon Surgery. On Social Hx the pt reports that he is a non-smoker and does not drink much alcohol On Family Hx the pt reports brother with a previous blood clot (other risk factors involved)    MEDICAL HISTORY:  Past Medical History:  Diagnosis Date  . ADD (attention deficit disorder) 11/27/2011  . Hypertension 06/03/2019  . Hypogonadism male 04/26/2013  . Obesity (BMI 30.0-34.9) 06/03/2019    SURGICAL HISTORY: Past Surgical History:  Procedure Laterality Date  . ACHILLES TENDON SURGERY Bilateral   . IR ANGIOGRAM PULMONARY BILATERAL SELECTIVE  06/03/2019  . IR ANGIOGRAM SELECTIVE EACH ADDITIONAL VESSEL  06/03/2019  . IR ANGIOGRAM SELECTIVE EACH ADDITIONAL VESSEL  06/03/2019  . IR INFUSION THROMBOL ARTERIAL INITIAL (MS)  06/03/2019  . IR INFUSION THROMBOL ARTERIAL INITIAL (MS)  06/03/2019  . IR THROMB F/U EVAL ART/VEN FINAL DAY (MS)  06/04/2019  . IR US GUIDE VASC ACCESS RIGHT  06/03/2019    SOCIAL HISTORY: Social History   Socioeconomic History  . Marital status: Married    Spouse name: Not on file  . Number of children: Not on file  . Years of education: Not on file  . Highest education level: Not on file  Occupational History  . Not on file  Tobacco Use  . Smoking status: Never Smoker  . Smokeless tobacco: Never Used  Substance and Sexual Activity  . Alcohol use: Yes    Alcohol/week: 0.0 standard drinks  . Drug use: No  . Sexual activity: Yes  Other Topics Concern  . Not on file  Social History Narrative  . Not on file   Social Determinants of Health   Financial Resource Strain:   . Difficulty of Paying Living Expenses: Not on file  Food Insecurity:   . Worried About Programme researcher, broadcasting/film/videounning Out of Food in the Last Year: Not on file  . Ran Out of Food in the Last Year: Not on  file  Transportation Needs:   . Lack of Transportation (Medical): Not on file  . Lack of Transportation (Non-Medical): Not on file  Physical Activity:   . Days of Exercise per Week: Not on file  . Minutes of Exercise per Session: Not on file  Stress:   . Feeling of Stress : Not on file  Social Connections:   . Frequency of Communication with Friends and Family: Not on file  . Frequency of Social Gatherings with Friends and Family: Not on file  . Attends Religious Services: Not on file  . Active Member of Clubs or Organizations: Not on file  . Attends BankerClub or Organization Meetings: Not on file  . Marital Status: Not on file  Intimate Partner Violence:   . Fear of Current or Ex-Partner: Not on file  . Emotionally Abused: Not on file  . Physically Abused: Not on file  . Sexually Abused: Not on file    FAMILY HISTORY: Family History  Problem Relation Age of Onset  . Diabetes Mother   . Deep vein  thrombosis Brother   . Diabetes Maternal Grandmother   . Heart disease Maternal Grandmother   . Diabetes Maternal Grandfather   . Cancer Paternal Grandmother        pancreatic cancer  . Heart disease Paternal Grandfather     ALLERGIES:  has No Known Allergies.  MEDICATIONS:  Current Outpatient Medications  Medication Sig Dispense Refill  . apixaban (ELIQUIS) 5 MG TABS tablet 2 tabs twice daily for 7 days followed by 1 tab twice daily for lifelong. 75 tablet 0  . hydrochlorothiazide (HYDRODIURIL) 25 MG tablet Take 25 mg by mouth daily.     No current facility-administered medications for this visit.    REVIEW OF SYSTEMS:    10 Point review of Systems was done is negative except as noted above.  PHYSICAL EXAMINATION: ECOG PERFORMANCE STATUS:   . Vitals:   06/23/19 1020  BP: 128/86  Pulse: 83  Resp: 18  Temp: 98.2 F (36.8 C)  SpO2: 100%   Filed Weights   06/23/19 1020  Weight: 236 lb 11.2 oz (107.4 kg)   .Body mass index is 35.99 kg/m.   GENERAL:alert, in no  acute distress and comfortable SKIN: no acute rashes, no significant lesions EYES: conjunctiva are pink and non-injected, sclera anicteric OROPHARYNX: MMM, no exudates, no oropharyngeal erythema or ulceration NECK: supple, no JVD LYMPH:  no palpable lymphadenopathy in the cervical, axillary or inguinal regions LUNGS: clear to auscultation b/l with normal respiratory effort HEART: regular rate & rhythm ABDOMEN:  normoactive bowel sounds , non tender, not distended. Extremity: no pedal edema PSYCH: alert & oriented x 3 with fluent speech NEURO: no focal motor/sensory deficits  LABORATORY DATA:  I have reviewed the data as listed  . CBC Latest Ref Rng & Units 06/07/2019 06/06/2019 06/05/2019  WBC 4.0 - 10.5 K/uL 7.2 8.0 9.0  Hemoglobin 13.0 - 17.0 g/dL 16.1 09.6 04.5  Hematocrit 39.0 - 52.0 % 44.4 44.1 44.1  Platelets 150 - 400 K/uL 269 247 209    . CMP Latest Ref Rng & Units 06/04/2019 06/03/2019 07/27/2014  Glucose 70 - 99 mg/dL 409(W) 119(J) 88  BUN 6 - 20 mg/dL 20 47(W) 14  Creatinine 0.61 - 1.24 mg/dL 2.95(A) 2.13(Y) 8.65  Sodium 135 - 145 mmol/L 138 137 139  Potassium 3.5 - 5.1 mmol/L 3.8 4.0 4.6  Chloride 98 - 111 mmol/L 105 103 101  CO2 22 - 32 mmol/L 21(L) 22 28  Calcium 8.9 - 10.3 mg/dL 7.8(I) 9.1 9.5  Total Protein 6.5 - 8.1 g/dL 6.4(L) - 7.4  Total Bilirubin 0.3 - 1.2 mg/dL 1.2 - 0.8  Alkaline Phos 38 - 126 U/L 49 - 44  AST 15 - 41 U/L 22 - 17  ALT 0 - 44 U/L 43 - 20     RADIOGRAPHIC STUDIES: I have personally reviewed the radiological images as listed and agreed with the findings in the report. CT Angio Chest PE W and/or Wo Contrast  Result Date: 06/03/2019 CLINICAL DATA:  Shortness of breath EXAM: CT ANGIOGRAPHY CHEST WITH CONTRAST TECHNIQUE: Multidetector CT imaging of the chest was performed using the standard protocol during bolus administration of intravenous contrast. Multiplanar CT image reconstructions and MIPs were obtained to evaluate the vascular  anatomy. CONTRAST:  OMNIPAQUE IOHEXOL 350 MG/ML SOLN COMPARISON:  Chest radiograph June 03, 2019 FINDINGS: Cardiovascular: There is extensive pulmonary embolus arising in both main pulmonary arteries, more severe on the right than on the left, with extension of pulmonary emboli into multiple upper and  lower lobe pulmonary artery branches. The right ventricle to left ventricle diameter ratio is 2.3, consistent with right heart strain. There is no thoracic aortic aneurysm or dissection. The visualized great vessels appear normal. There is a small pericardial effusion. There is no pericardial thickening. Mediastinum/Nodes: Thyroid appears normal. There is no appreciable thoracic adenopathy. No esophageal lesions are evident. Lungs/Pleura: There is extensive airspace consolidation consistent with pneumonia in the left upper lobe with involvement of portions of the anterior and posterior segments of the left upper lobe. There is more patchy infiltrate in the medial aspect of the superior segment of the right lower lobe as well as in portions of the medial and posterior segments of the left lower lobe. No pleural effusions are evident. Upper Abdomen: There is reflux of contrast into the inferior vena cava and hepatic veins. Visualized upper abdominal structures otherwise appear unremarkable. Musculoskeletal: There is mild thoracic dextroscoliosis. There are no blastic or lytic bone lesions. No chest wall lesions are evident. Review of the MIP images confirms the above findings. IMPRESSION: 1. Extensive bilateral pulmonary embolus with pulmonary emboli extending from the main pulmonary outflow tracks throughout the upper and lower lobe pulmonary artery systems bilaterally. Positive for acute PE with CT evidence of right heart strain (RV/LV Ratio = 2.3) consistent with at least submassive (intermediate risk) PE. The presence of right heart strain has been associated with an increased risk of morbidity and  mortality. Please activate Code PE by paging [phone]. 2. Extensive airspace consolidation consistent with pneumonia in portions of the left upper and left lower lobes. A small amount of infiltrate is noted in the superior segment right lower lobe. A degree of intermingled pulmonary infarct cannot be excluded by CT. 3. Reflux of contrast into the inferior vena cava and hepatic veins, a finding indicative of increased right heart pressure. 4.  Small pericardial effusion. 5.  No thoracic aortic aneurysm or dissection. 6.  No appreciable adenopathy. Critical Value/emergent results were called by telephone at the time of interpretation on 06/03/2019 at 11:55 am to providerSCOTT GOLDSTON , who verbally acknowledged these results. Electronically Signed   By: Bretta Bang III M.D.   On: 06/03/2019 11:55   IR Angiogram Pulmonary Bilateral Selective  Result Date: 06/04/2019 INDICATION: 54 year old male with history of submassive high risk PE, presents for treatment. EXAM: US GUIDED ACCESS RIGHT COMMON FEMORAL VEIN X 2 INITIATION OF BILATERAL CATHETER DIRECTED THROMBOLYSIS PULMONARY ARTERIES COMPARISON:  Same day CT imaging MEDICATIONS: None. ANESTHESIA/SEDATION: Versed 1.5  Mg IV; Fentanyl 75 mcg IV Moderate Sedation Time: 30 min The patient was continuously monitored during the procedure by the interventional radiology nurse under my direct supervision. FLUOROSCOPY TIME:  Fluoroscopy Time: 6 minutes 24 seconds (222 mGy). COMPLICATIONS: None TECHNIQUE: Informed consent was obtained from the patient and the patient's family following explanation of the procedure, risks, benefits and alternatives. Specific risks include bleeding, infection, contrast reaction, kidney injury, venous injury, life-threatening hemorrhage including brain hemorrhage, gastrointestinal hemorrhage, epistaxis, need for further surgery, need for further procedure, cardiopulmonary collapse, death. The patient understands, agrees and consents  for the procedure. All questions were addressed. Patient is position supine position on the fluoroscopy table. Maximal barrier sterile technique utilized including caps, mask, sterile gowns, sterile gloves, large sterile drape, hand hygiene, and betadine prep. 1% lidocaine used for local anesthesia. Moderate sedation was provided. Ultrasound survey of the right inguinal region was performed with images stored and sent to PACs. A single wall needle was used access the right  common femoral vein under ultrasound. With venous blood flow returned, an 035 wire was passed through the needle into the IVC. A 7 French sheath was placed over the wire. The dilator was removed and the sheath was flushed. Subsequently, a single wall needle was used access the right common femoral vein adjacent to the initial puncture. With venous blood flow returned, a 6 [place] vascular sheath was placed over the wire. The dilator was removed and the sheath was flushed. Combination of an angled pigtail catheter and benson wire was then used to select the main pulmonary artery. Wire was removed and a main pulmonary artery pressure transduction was recorded. Glidewire was then advanced into the right pulmonary artery lower lobe branches. The pigtail catheter was removed, and we selected a 90cm length, 15cm infusion length unifuse catheter, placed into the lower lobe branches. Bern catheter and glide wire was then used to navigate into the left-sided pulmonary veins, with selection of lower lobe pulmonary vein. Glidewire was used to advance into a more distal position and the catheter was placed into lower lobe segments. Catheter was removed and a 15 cm infusion length unifuse catheter was placed. Small contrast infusion confirmed position. Catheter was flushed. The obturator wires were then placed through both the left and right catheters. Sheaths were secured in position.  Final image was stored. The patient tolerated the procedure well and  remained hemodynamically stable throughout. No complications were encountered and no significant blood loss was encountered. FINDINGS: Ultrasound survey demonstrates patent right common femoral vein. Main pulmonary artery pressure measures 76/20 (41). Final image demonstrates right-sided catheter directed into lower lobar branches and left sided catheter directed into lower lobar branches. The 7 French (orange) sheaths transmits the right-sided catheter. The 6 French (green) sheath transmits the left-sided pulmonary artery catheter. IMPRESSION: Status post placement of left and right pulmonary artery infusion catheters for initiation of catheter directed thrombolysis. Signed, Yvone Neu. Loreta Ave DO [facility] Radiology PLAN: Patient will be ICU status overnight. Bed rest. Both the right and left catheter will receive 1 mg tPA per hour for 12 hours, for a total dose of 24 mg over 12 hours. Plan for repeat trans duction of catheter pressures after treatment, reassessment, and probable removal of catheters. EVery 6 our blood draw, including CBC, heparin level, and fibrinogen. Electronically Signed   By: Gilmer Mor D.O.   On: 06/04/2019 08:07   IR Angiogram Selective Each Additional Vessel  Result Date: 06/04/2019 INDICATION: 54 year old male with history of submassive high risk PE, presents for treatment. EXAM: US GUIDED ACCESS RIGHT COMMON FEMORAL VEIN X 2 INITIATION OF BILATERAL CATHETER DIRECTED THROMBOLYSIS PULMONARY ARTERIES COMPARISON:  Same day CT imaging MEDICATIONS: None. ANESTHESIA/SEDATION: Versed 1.5  Mg IV; Fentanyl 75 mcg IV Moderate Sedation Time: 30 min The patient was continuously monitored during the procedure by the interventional radiology nurse under my direct supervision. FLUOROSCOPY TIME:  Fluoroscopy Time: 6 minutes 24 seconds (222 mGy). COMPLICATIONS: None TECHNIQUE: Informed consent was obtained from the patient and the patient's family  following explanation of the procedure, risks, benefits and alternatives. Specific risks include bleeding, infection, contrast reaction, kidney injury, venous injury, life-threatening hemorrhage including brain hemorrhage, gastrointestinal hemorrhage, epistaxis, need for further surgery, need for further procedure, cardiopulmonary collapse, death. The patient understands, agrees and consents for the procedure. All questions were addressed. Patient is position supine position on the fluoroscopy table. Maximal barrier sterile technique utilized including caps, mask, sterile gowns, sterile gloves, large sterile  drape, hand hygiene, and betadine prep. 1% lidocaine used for local anesthesia. Moderate sedation was provided. Ultrasound survey of the right inguinal region was performed with images stored and sent to PACs. A single wall needle was used access the right common femoral vein under ultrasound. With venous blood flow returned, an 035 wire was passed through the needle into the IVC. A 7 French sheath was placed over the wire. The dilator was removed and the sheath was flushed. Subsequently, a single wall needle was used access the right common femoral vein adjacent to the initial puncture. With venous blood flow returned, a 6 [place] vascular sheath was placed over the wire. The dilator was removed and the sheath was flushed. Combination of an angled pigtail catheter and benson wire was then used to select the main pulmonary artery. Wire was removed and a main pulmonary artery pressure transduction was recorded. Glidewire was then advanced into the right pulmonary artery lower lobe branches. The pigtail catheter was removed, and we selected a 90cm length, 15cm infusion length unifuse catheter, placed into the lower lobe branches. Bern catheter and glide wire was then used to navigate into the left-sided pulmonary veins, with selection of lower lobe pulmonary vein. Glidewire was used to advance into a more distal  position and the catheter was placed into lower lobe segments. Catheter was removed and a 15 cm infusion length unifuse catheter was placed. Small contrast infusion confirmed position. Catheter was flushed. The obturator wires were then placed through both the left and right catheters. Sheaths were secured in position.  Final image was stored. The patient tolerated the procedure well and remained hemodynamically stable throughout. No complications were encountered and no significant blood loss was encountered. FINDINGS: Ultrasound survey demonstrates patent right common femoral vein. Main pulmonary artery pressure measures 76/20 (41). Final image demonstrates right-sided catheter directed into lower lobar branches and left sided catheter directed into lower lobar branches. The 7 French (orange) sheaths transmits the right-sided catheter. The 6 French (green) sheath transmits the left-sided pulmonary artery catheter. IMPRESSION: Status post placement of left and right pulmonary artery infusion catheters for initiation of catheter directed thrombolysis. Signed, Yvone Neu. Loreta Ave DO [facility] Radiology PLAN: Patient will be ICU status overnight. Bed rest. Both the right and left catheter will receive 1 mg tPA per hour for 12 hours, for a total dose of 24 mg over 12 hours. Plan for repeat trans duction of catheter pressures after treatment, reassessment, and probable removal of catheters. EVery 6 our blood draw, including CBC, heparin level, and fibrinogen. Electronically Signed   By: Gilmer Mor D.O.   On: 06/04/2019 08:07   IR Angiogram Selective Each Additional Vessel  Result Date: 06/04/2019 INDICATION: 54 year old male with history of submassive high risk PE, presents for treatment. EXAM: US GUIDED ACCESS RIGHT COMMON FEMORAL VEIN X 2 INITIATION OF BILATERAL CATHETER DIRECTED THROMBOLYSIS PULMONARY ARTERIES COMPARISON:  Same day CT imaging MEDICATIONS: None.  ANESTHESIA/SEDATION: Versed 1.5  Mg IV; Fentanyl 75 mcg IV Moderate Sedation Time: 30 min The patient was continuously monitored during the procedure by the interventional radiology nurse under my direct supervision. FLUOROSCOPY TIME:  Fluoroscopy Time: 6 minutes 24 seconds (222 mGy). COMPLICATIONS: None TECHNIQUE: Informed consent was obtained from the patient and the patient's family following explanation of the procedure, risks, benefits and alternatives. Specific risks include bleeding, infection, contrast reaction, kidney injury, venous injury, life-threatening hemorrhage including brain hemorrhage, gastrointestinal hemorrhage, epistaxis, need for further surgery, need for  further procedure, cardiopulmonary collapse, death. The patient understands, agrees and consents for the procedure. All questions were addressed. Patient is position supine position on the fluoroscopy table. Maximal barrier sterile technique utilized including caps, mask, sterile gowns, sterile gloves, large sterile drape, hand hygiene, and betadine prep. 1% lidocaine used for local anesthesia. Moderate sedation was provided. Ultrasound survey of the right inguinal region was performed with images stored and sent to PACs. A single wall needle was used access the right common femoral vein under ultrasound. With venous blood flow returned, an 035 wire was passed through the needle into the IVC. A 7 French sheath was placed over the wire. The dilator was removed and the sheath was flushed. Subsequently, a single wall needle was used access the right common femoral vein adjacent to the initial puncture. With venous blood flow returned, a 6 [place] vascular sheath was placed over the wire. The dilator was removed and the sheath was flushed. Combination of an angled pigtail catheter and benson wire was then used to select the main pulmonary artery. Wire was removed and a main pulmonary artery pressure transduction was recorded. Glidewire was then  advanced into the right pulmonary artery lower lobe branches. The pigtail catheter was removed, and we selected a 90cm length, 15cm infusion length unifuse catheter, placed into the lower lobe branches. Bern catheter and glide wire was then used to navigate into the left-sided pulmonary veins, with selection of lower lobe pulmonary vein. Glidewire was used to advance into a more distal position and the catheter was placed into lower lobe segments. Catheter was removed and a 15 cm infusion length unifuse catheter was placed. Small contrast infusion confirmed position. Catheter was flushed. The obturator wires were then placed through both the left and right catheters. Sheaths were secured in position.  Final image was stored. The patient tolerated the procedure well and remained hemodynamically stable throughout. No complications were encountered and no significant blood loss was encountered. FINDINGS: Ultrasound survey demonstrates patent right common femoral vein. Main pulmonary artery pressure measures 76/20 (41). Final image demonstrates right-sided catheter directed into lower lobar branches and left sided catheter directed into lower lobar branches. The 7 French (orange) sheaths transmits the right-sided catheter. The 6 French (green) sheath transmits the left-sided pulmonary artery catheter. IMPRESSION: Status post placement of left and right pulmonary artery infusion catheters for initiation of catheter directed thrombolysis. Signed, Yvone Neu. Loreta Ave DO [facility] PLAN: Patient will be ICU status overnight. Bed rest. Both the right and left catheter will receive 1 mg tPA per hour for 12 hours, for a total dose of 24 mg over 12 hours. Plan for repeat trans duction of catheter pressures after treatment, reassessment, and probable removal of catheters. EVery 6 our blood draw, including CBC, heparin level, and fibrinogen. Electronically Signed   By:  Gilmer Mor D.O.   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 EXAM: US GUIDED ACCESS RIGHT COMMON FEMORAL VEIN X 2 INITIATION OF BILATERAL CATHETER DIRECTED THROMBOLYSIS PULMONARY ARTERIES COMPARISON:  Same day CT imaging MEDICATIONS: None. ANESTHESIA/SEDATION: Versed 1.5  Mg IV; Fentanyl 75 mcg IV Moderate Sedation Time: 30 min The patient was continuously monitored during the procedure by the interventional radiology nurse under my direct supervision. FLUOROSCOPY TIME:  Fluoroscopy Time: 6 minutes 24 seconds (222 mGy). COMPLICATIONS: None TECHNIQUE: Informed consent was obtained  from the patient and the patient's family following explanation of the procedure, risks, benefits and alternatives. Specific risks include bleeding, infection, contrast reaction, kidney injury, venous injury, life-threatening hemorrhage including brain hemorrhage, gastrointestinal hemorrhage, epistaxis, need for further surgery, need for further procedure, cardiopulmonary collapse, death. The patient understands, agrees and consents for the procedure. All questions were addressed. Patient is position supine position on the fluoroscopy table. Maximal barrier sterile technique utilized including caps, mask, sterile gowns, sterile gloves, large sterile drape, hand hygiene, and betadine prep. 1% lidocaine used for local anesthesia. Moderate sedation was provided. Ultrasound survey of the right inguinal region was performed with images stored and sent to PACs. A single wall needle was used access the right common femoral vein under ultrasound. With venous blood flow returned, an 035 wire was passed through the needle into the IVC. A 7 French sheath was placed over the wire. The dilator was removed and the sheath was flushed. Subsequently, a single wall needle was used access the right common femoral vein  adjacent to the initial puncture. With venous blood flow returned, a 6 [place] vascular sheath was placed over the wire. The dilator was removed and the sheath was flushed. Combination of an angled pigtail catheter and benson wire was then used to select the main pulmonary artery. Wire was removed and a main pulmonary artery pressure transduction was recorded. Glidewire was then advanced into the right pulmonary artery lower lobe branches. The pigtail catheter was removed, and we selected a 90cm length, 15cm infusion length unifuse catheter, placed into the lower lobe branches. Bern catheter and glide wire was then used to navigate into the left-sided pulmonary veins, with selection of lower lobe pulmonary vein. Glidewire was used to advance into a more distal position and the catheter was placed into lower lobe segments. Catheter was removed and a 15 cm infusion length unifuse catheter was placed. Small contrast infusion confirmed position. Catheter was flushed. The obturator wires were then placed through both the left and right catheters. Sheaths were secured in position.  Final image was stored. The patient tolerated the procedure well and remained hemodynamically stable throughout. No complications were encountered and no significant blood loss was encountered. FINDINGS: Ultrasound survey demonstrates patent right common femoral vein. Main pulmonary artery pressure measures 76/20 (41). Final image demonstrates right-sided catheter directed into lower lobar branches and left sided catheter directed into lower lobar branches. The 7 French (orange) sheaths transmits the right-sided catheter. The 6 French (green) sheath transmits the left-sided pulmonary artery catheter. IMPRESSION: Status post placement of left and right pulmonary artery infusion catheters for initiation of catheter directed thrombolysis. Signed, Yvone Neu. Loreta Ave DO [facility] Radiology PLAN:  Patient will be ICU status overnight. Bed rest. Both the right and left catheter will receive 1 mg tPA per hour for 12 hours, for a total dose of 24 mg over 12 hours. Plan for repeat trans duction of catheter pressures after treatment, reassessment, and probable removal of catheters. EVery 6 our blood draw, including CBC, heparin level, and fibrinogen. Electronically Signed   By: Gilmer Mor D.O.   On: 06/04/2019 08:07   DG Chest Portable 1 View  Result Date: 06/03/2019 CLINICAL DATA:  Cough and shortness of breath EXAM: PORTABLE CHEST 1 VIEW COMPARISON:  None. FINDINGS: There is an area of ill-defined increased opacity in the left upper lobe. Lungs elsewhere are clear. Heart is borderline enlarged with pulmonary vascularity normal. No adenopathy. No bone lesions. IMPRESSION: Ill-defined area of  opacity in the left upper lobe, suspicious for early pneumonia. Lungs elsewhere clear. Borderline cardiac enlargement. Electronically Signed   By: Lowella Grip III M.D.   On: 06/03/2019 10:12   ECHOCARDIOGRAM COMPLETE  Result Date: 06/03/2019   ECHOCARDIOGRAM REPORT   Patient Name:   Ricky Bentley Date of Exam: 06/03/2019 Medical Rec #:  [id number]        Height:       70.0 in Accession #:    [id number]       Weight:       230.0 lb Date of Birth:  [birth year]        BSA:          2.22 m Patient Age:    38 years         BP:           156/113 mmHg Patient Gender: M                HR:           103 bpm. Exam Location:  Inpatient Procedure: 2D Echo, Cardiac Doppler and Color Doppler Indications:    Pulmonary embolus 415.19  History:        Patient has no prior history of Echocardiogram examinations.                 Risk Factors:Non-Smoker. Pulmonary embolus.  Sonographer:    Paulita Fujita RDCS Referring Phys: [id number] DAVID MANUEL Oakley  1. Left ventricular ejection fraction, by visual estimation, is 55 to 60%. The left ventricle has hyperdynamic function. There is mildly increased left ventricular  hypertrophy.  2. Left ventricular diastolic parameters are consistent with Grade I diastolic dysfunction (impaired relaxation).  3. Right ventricular volume and pressure overload.  4. The left ventricle demonstrates regional wall motion abnormalities.  5. Global right ventricle has moderately reduced systolic function.The right ventricular size is moderately enlarged. No increase in right ventricular wall thickness.  6. Left atrial size was normal.  7. Right atrial size was normal.  8. Small pericardial effusion.  9. The pericardial effusion is circumferential. 10. The mitral valve is grossly normal. Trivial mitral valve regurgitation. 11. The tricuspid valve is grossly normal. Tricuspid valve regurgitation is trivial. 12. The aortic valve is tricuspid. Aortic valve regurgitation is not visualized. 13. The pulmonic valve was grossly normal. Pulmonic valve regurgitation is trivial. 14. Severely elevated pulmonary artery systolic pressure. 15. The inferior vena cava is dilated in size with <50% respiratory variability, suggesting right atrial pressure of 15 mmHg. 16. Findings consistent with RV strain FINDINGS  Left Ventricle: Left ventricular ejection fraction, by visual estimation, is 55 to 60%. The left ventricle has hyperdynamic function. The left ventricle demonstrates regional wall motion abnormalities. There is mildly increased left ventricular hypertrophy. The interventricular septum is flattened in systole and diastole, consistent with right ventricular pressure and volume overload. Left ventricular diastolic parameters are consistent with Grade I diastolic dysfunction (impaired relaxation). Normal left atrial pressure. Right Ventricle: The right ventricular size is moderately enlarged. No increase in right ventricular wall thickness. Global RV systolic function is has moderately reduced systolic function. The tricuspid regurgitant velocity is 3.95 m/s, and with an assumed right atrial pressure of 15 mmHg,  the estimated right ventricular systolic pressure is severely elevated at 77.4 mmHg. Left Atrium: Left atrial size was normal in size. Right Atrium: Right atrial size was normal in size Pericardium: A small pericardial effusion is present. The pericardial effusion is circumferential. Mitral Valve: The  mitral valve is grossly normal. Trivial mitral valve regurgitation. Tricuspid Valve: The tricuspid valve is grossly normal. Tricuspid valve regurgitation is trivial. Aortic Valve: The aortic valve is tricuspid. Aortic valve regurgitation is not visualized. Pulmonic Valve: The pulmonic valve was grossly normal. Pulmonic valve regurgitation is trivial. Pulmonic regurgitation is trivial. Aorta: The aortic root and ascending aorta are structurally normal, with no evidence of dilitation. Venous: The inferior vena cava is dilated in size with less than 50% respiratory variability, suggesting right atrial pressure of 15 mmHg. IAS/Shunts: No atrial level shunt detected by color flow Doppler.  LEFT VENTRICLE PLAX 2D LVIDd:         3.90 cm       Diastology LVIDs:         2.50 cm       LV e' lateral:   11.50 cm/s LV PW:         1.00 cm       LV E/e' lateral: 3.2 LV IVS:        1.00 cm       LV e' medial:    7.07 cm/s LVOT diam:     2.10 cm       LV E/e' medial:  5.3 LV SV:         44 ml LV SV Index:   18.92 LVOT Area:     3.46 cm  LV Volumes (MOD) LV area d, A2C:    27.20 cm LV area d, A4C:    28.90 cm LV area s, A2C:    16.50 cm LV area s, A4C:    19.60 cm LV major d, A2C:   8.14 cm LV major d, A4C:   7.80 cm LV major s, A2C:   6.89 cm LV major s, A4C:   6.42 cm LV vol d, MOD A2C: 77.7 ml LV vol d, MOD A4C: 95.9 ml LV vol s, MOD A2C: 35.0 ml LV vol s, MOD A4C: 50.7 ml LV SV MOD A2C:     42.7 ml LV SV MOD A4C:     95.9 ml LV SV MOD BP:      44.8 ml RIGHT VENTRICLE RV S prime:     9.14 cm/s TAPSE (M-mode): 1.6 cm LEFT ATRIUM             Index       RIGHT ATRIUM           Index LA diam:        2.90 cm 1.31 cm/m  RA Area:      21.50 cm LA Vol (A2C):   25.3 ml 11.42 ml/m RA Volume:   67.80 ml  30.61 ml/m LA Vol (A4C):   29.1 ml 13.14 ml/m LA Biplane Vol: 28.1 ml 12.69 ml/m  AORTIC VALVE LVOT Vmax:   69.60 cm/s LVOT Vmean:  50.600 cm/s LVOT VTI:    0.112 m  AORTA Ao Root diam: 3.20 cm MITRAL VALVE                        TRICUSPID VALVE MV Area (PHT): 2.91 cm             TR Peak grad:   62.4 mmHg MV PHT:        75.69 msec           TR Vmax:        395.00 cm/s MV Decel Time: 261 msec MV E velocity: 37.20 cm/s 103 cm/s  SHUNTS  MV A velocity: 44.50 cm/s 70.3 cm/s Systemic VTI:  0.11 m MV E/A ratio:  0.84       1.5       Systemic Diam: 2.10 cm  Zoila Shutter MD Electronically signed by Zoila Shutter MD Signature Date/Time: 06/03/2019/2:36:09 PM    Final    VAS [place] LOWER EXTREMITY VENOUS (DVT)  Result Date: 06/05/2019  Lower Venous Study Indications: Pulmonary embolism.  Risk Factors: Confirmed PE. Anticoagulation: Heparin. Limitations: Poor ultrasound/tissue interface. Comparison Study: No prior studies. Performing Technologist: Chanda Busing RVT  Examination Guidelines: A complete evaluation includes B-mode imaging, spectral Doppler, color Doppler, and power Doppler as needed of all accessible portions of each vessel. Bilateral testing is considered an integral part of a complete examination. Limited examinations for reoccurring indications may be performed as noted.  +---------+---------------+---------+-----------+----------+--------------+ RIGHT    CompressibilityPhasicitySpontaneityPropertiesThrombus Aging +---------+---------------+---------+-----------+----------+--------------+ CFV      Full           Yes      Yes                                 +---------+---------------+---------+-----------+----------+--------------+ SFJ      Full                                                        +---------+---------------+---------+-----------+----------+--------------+ FV Prox  Full                                                         +---------+---------------+---------+-----------+----------+--------------+ FV Mid   Full                                                        +---------+---------------+---------+-----------+----------+--------------+ FV DistalNone           No       No                   Acute          +---------+---------------+---------+-----------+----------+--------------+ PFV      Full                                                        +---------+---------------+---------+-----------+----------+--------------+ POP      None           No       No                   Acute          +---------+---------------+---------+-----------+----------+--------------+ PTV      Full                                                        +---------+---------------+---------+-----------+----------+--------------+  PERO     Partial                                      Acute          +---------+---------------+---------+-----------+----------+--------------+   +---------+---------------+---------+-----------+----------+--------------+ LEFT     CompressibilityPhasicitySpontaneityPropertiesThrombus Aging +---------+---------------+---------+-----------+----------+--------------+ CFV      Full           Yes      Yes                                 +---------+---------------+---------+-----------+----------+--------------+ SFJ      Full                                                        +---------+---------------+---------+-----------+----------+--------------+ FV Prox  Full                                                        +---------+---------------+---------+-----------+----------+--------------+ FV Mid   Full                                                        +---------+---------------+---------+-----------+----------+--------------+ FV DistalFull                                                         +---------+---------------+---------+-----------+----------+--------------+ PFV      Full                                                        +---------+---------------+---------+-----------+----------+--------------+ POP      Full           Yes      Yes                                 +---------+---------------+---------+-----------+----------+--------------+ PTV      Full                                                        +---------+---------------+---------+-----------+----------+--------------+ PERO     Full                                                        +---------+---------------+---------+-----------+----------+--------------+  Summary: Right: Findings consistent with acute deep vein thrombosis involving the right femoral vein, right popliteal vein, and right peroneal veins. No cystic structure found in the popliteal fossa. Left: There is no evidence of deep vein thrombosis in the lower extremity. No cystic structure found in the popliteal fossa.  *See table(s) above for measurements and observations. Electronically signed by Coral Else MD on 06/05/2019 at 4:43:07 PM.    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 EXAM: US GUIDED ACCESS RIGHT COMMON FEMORAL VEIN X 2 INITIATION OF BILATERAL CATHETER DIRECTED THROMBOLYSIS PULMONARY ARTERIES COMPARISON:  Same day CT imaging MEDICATIONS: None. ANESTHESIA/SEDATION: Versed 1.5  Mg IV; Fentanyl 75 mcg IV Moderate Sedation Time: 30 min The patient was continuously monitored during the procedure by the interventional radiology nurse under my direct supervision. FLUOROSCOPY TIME:  Fluoroscopy Time: 6 minutes 24 seconds (222 mGy). COMPLICATIONS: None TECHNIQUE: Informed consent was obtained from the patient and the patient's family following explanation of the procedure, risks, benefits and  alternatives. Specific risks include bleeding, infection, contrast reaction, kidney injury, venous injury, life-threatening hemorrhage including brain hemorrhage, gastrointestinal hemorrhage, epistaxis, need for further surgery, need for further procedure, cardiopulmonary collapse, death. The patient understands, agrees and consents for the procedure. All questions were addressed. Patient is position supine position on the fluoroscopy table. Maximal barrier sterile technique utilized including caps, mask, sterile gowns, sterile gloves, large sterile drape, hand hygiene, and betadine prep. 1% lidocaine used for local anesthesia. Moderate sedation was provided. Ultrasound survey of the right inguinal region was performed with images stored and sent to PACs. A single wall needle was used access the right common femoral vein under ultrasound. With venous blood flow returned, an 035 wire was passed through the needle into the IVC. A 7 French sheath was placed over the wire. The dilator was removed and the sheath was flushed. Subsequently, a single wall needle was used access the right common femoral vein adjacent to the initial puncture. With venous blood flow returned, a 6 [place] vascular sheath was placed over the wire. The dilator was removed and the sheath was flushed. Combination of an angled pigtail catheter and benson wire was then used to select the main pulmonary artery. Wire was removed and a main pulmonary artery pressure transduction was recorded. Glidewire was then advanced into the right pulmonary artery lower lobe branches. The pigtail catheter was removed, and we selected a 90cm length, 15cm infusion length unifuse catheter, placed into the lower lobe branches. Bern catheter and glide wire was then used to navigate into the left-sided pulmonary veins, with selection of lower lobe pulmonary vein. Glidewire was used to advance into a more distal position and the catheter was placed into lower lobe segments.  Catheter was removed and a 15 cm infusion length unifuse catheter was placed. Small contrast infusion confirmed position. Catheter was flushed. The obturator wires were then placed through both the left and right catheters. Sheaths were secured in position.  Final image was stored. The patient tolerated the procedure well and remained hemodynamically stable throughout. No complications were encountered and no significant blood loss was encountered. FINDINGS: Ultrasound survey demonstrates patent right common femoral vein. Main pulmonary artery pressure measures 76/20 (41). Final image demonstrates right-sided catheter directed into lower lobar branches and left sided catheter directed into lower lobar branches. The 7 French (orange) sheaths transmits the right-sided catheter. The 6  French (green) sheath transmits the left-sided pulmonary artery catheter. IMPRESSION: Status post placement of left and right pulmonary artery infusion catheters for initiation of catheter directed thrombolysis. Signed, Yvone NeuJaime S. Loreta AveWagner, DO [facility]: Patient will be ICU status overnight. Bed rest. Both the right and left catheter will receive 1 mg tPA per hour for 12 hours, for a total dose of 24 mg over 12 hours. Plan for repeat trans duction of catheter pressures after treatment, reassessment, and probable removal of catheters. EVery 6 our blood draw, including CBC, heparin level, and fibrinogen. Electronically Signed   By: Gilmer MorJaime  Wagner D.O.   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 EXAM: US GUIDED ACCESS RIGHT COMMON FEMORAL VEIN X 2 INITIATION OF BILATERAL CATHETER DIRECTED THROMBOLYSIS PULMONARY ARTERIES COMPARISON:  Same day CT imaging MEDICATIONS: None. ANESTHESIA/SEDATION: Versed 1.5  Mg IV; Fentanyl 75 mcg IV  Moderate Sedation Time: 30 min The patient was continuously monitored during the procedure by the interventional radiology nurse under my direct supervision. FLUOROSCOPY TIME:  Fluoroscopy Time: 6 minutes 24 seconds (222 mGy). COMPLICATIONS: None TECHNIQUE: Informed consent was obtained from the patient and the patient's family following explanation of the procedure, risks, benefits and alternatives. Specific risks include bleeding, infection, contrast reaction, kidney injury, venous injury, life-threatening hemorrhage including brain hemorrhage, gastrointestinal hemorrhage, epistaxis, need for further surgery, need for further procedure, cardiopulmonary collapse, death. The patient understands, agrees and consents for the procedure. All questions were addressed. Patient is position supine position on the fluoroscopy table. Maximal barrier sterile technique utilized including caps, mask, sterile gowns, sterile gloves, large sterile drape, hand hygiene, and betadine prep. 1% lidocaine used for local anesthesia. Moderate sedation was provided. Ultrasound survey of the right inguinal region was performed with images stored and sent to PACs. A single wall needle was used access the right common femoral vein under ultrasound. With venous blood flow returned, an 035 wire was passed through the needle into the IVC. A 7 French sheath was placed over the wire. The dilator was removed and the sheath was flushed. Subsequently, a single wall needle was used access the right common femoral vein adjacent to the initial puncture. With venous blood flow returned, a 6 [place] vascular sheath was placed over the wire. The dilator was removed and the sheath was flushed. Combination of an angled pigtail catheter and benson wire was then used to select the main pulmonary artery. Wire was removed and a main pulmonary artery pressure transduction was recorded. Glidewire was then advanced into the right pulmonary artery lower lobe branches.  The pigtail catheter was removed, and we selected a 90cm length, 15cm infusion length unifuse catheter, placed into the lower lobe branches. Bern catheter and glide wire was then used to navigate into the left-sided pulmonary veins, with selection of lower lobe pulmonary vein. Glidewire was used to advance into a more distal position and the catheter was placed into lower lobe segments. Catheter was removed and a 15 cm infusion length unifuse catheter was placed. Small contrast infusion confirmed position. Catheter was flushed. The obturator wires were then placed through both the left and right catheters. Sheaths were secured in position.  Final image was stored. The patient tolerated the procedure well and remained hemodynamically stable throughout. No complications were encountered and no significant blood loss was encountered. FINDINGS: Ultrasound survey demonstrates patent  right common femoral vein. Main pulmonary artery pressure measures 76/20 (41). Final image demonstrates right-sided catheter directed into lower lobar branches and left sided catheter directed into lower lobar branches. The 7 French (orange) sheaths transmits the right-sided catheter. The 6 French (green) sheath transmits the left-sided pulmonary artery catheter. IMPRESSION: Status post placement of left and right pulmonary artery infusion catheters for initiation of catheter directed thrombolysis. Signed, Yvone Neu. Loreta Ave DO [facility] Radiology PLAN: Patient will be ICU status overnight. Bed rest. Both the right and left catheter will receive 1 mg tPA per hour for 12 hours, for a total dose of 24 mg over 12 hours. Plan for repeat trans duction of catheter pressures after treatment, reassessment, and probable removal of catheters. EVery 6 our blood draw, including CBC, heparin level, and fibrinogen. Electronically Signed   By: Gilmer Mor D.O.   On: 06/04/2019 08:07   IR THROMB F/U EVAL  ART/VEN FINAL DAY (MS)  Result Date: 06/04/2019 INDICATION: Acute sub massive bilateral pulmonary emboli, hypertension, obesity EXAM: PE thrombo lysis protocol, final day COMPARISON:  06/03/2019 MEDICATIONS: None. ANESTHESIA/SEDATION: Moderate Sedation Time:  None. The patient was continuously monitored during the procedure by the interventional radiology nurse under my direct supervision. FLUOROSCOPY TIME:  Fluoroscopy Time: None. COMPLICATIONS: None immediate. TECHNIQUE: At the bedside, a central pulmonary artery pressure was obtained through 1 of the infusion catheters. Following this, both infusion catheters and right femoral access sheath were removed. Hemostasis obtained with compression. Sterile dressing applied. No immediate complication. Patient tolerated the procedure well. FINDINGS: Post PE lysis protocol PA pressure: 50/16 (28) This is decreased from a pretreatment pressure of 76/20 (41) IMPRESSION: Successful completion of the PE thrombo lysis protocol. Reduction in the central pulmonary artery pressure as above. Electronically Signed   By: Judie Petit.  Shick M.D.   On: 06/04/2019 11:03    ASSESSMENT & PLAN:   54 yo with   1) Acute Rt LE DVT 2) Acute extensive b/l Pulmonary emboli with rt heart strain s/p cath directed lytics ?provoking event - testosterone use 3) r/o Primary hypercoagulable state. PLAN: -Discussed patient's most recent labs from 06/07/2019, all values are WNL. -Discussed  06/04/2019 VAS [place] Lower Extremity Venous Bilat (DVT) which revealed "Right: Findings consistent with acute deep vein thrombosis involving the right femoral vein, right popliteal vein, and right peroneal veins. No cystic structure found in the popliteal fossa. Left: There is no evidence of deep vein thrombosis in the lower extremity. No cystic structure found in the popliteal fossa."  -Discussed  06/03/2019 CT Angio Chest PE ([id number]) which revealed "1. Extensive bilateral pulmonary embolus with pulmonary  emboli extending from the main pulmonary outflow tracks throughout the upper and lower lobe pulmonary artery systems bilaterally. Positive for acute PE with CT evidence of right heart strain (RV/LV Ratio = 2.3) consistent with at least submassive (intermediate risk) PE. The presence of right heart strain has been associated with an increased risk of morbidity and mortality. Please activate Code PE by paging [phone]. 2. Extensive airspace consolidation consistent with pneumonia in portions of the left upper and left lower lobes. A small amount of infiltrate is noted in the superior segment right lower lobe. A degree of intermingled pulmonary infarct cannot be excluded by CT. 3. Reflux of contrast into the inferior vena cava and hepatic veins, a finding indicative of increased right heart pressure. 4.  Small pericardial effusion. 5.  No thoracic aortic aneurysm or dissection. 6.  No appreciable adenopathy." -Advised pt  that we recommend life long blood thinners for extensive clots w/o a provoking event  -Advised pt that we typically continue blood thinners for at least 6-9 months after extensive clots to give blood vessels time to heal -Advised pt that testosterone injections are a risk factor for blood clots, can not r/o other risk factors at this time -Advised pt to immediately discontinue all testosterone replacement  -Would recommend a rpt ECHO in 3-4 months  -Will get labs today, including hypercoagulable workup -Refill Eliquis  -Will see back in 2 weeks via phone   FOLLOW UP: Labs today Phone visit with Dr Candise Che in 2 weeks  All of the patients questions were answered with apparent satisfaction. The patient knows to call the clinic with any problems, questions or concerns.  I spent 40 mins counseling the patient face to face. The total time spent in the appointment was 60 minutes and more than 50% was on counseling and direct patient cares.    Wyvonnia Lora MD MS AAHIVMS [facility]  [facility] Hematology/Oncology Physician [facility]  (Office):       [phone] (Work cell):  [phone] (Fax):           [phone]  06/23/2019 11:14 AM  I, Carollee Herter, am acting as a scribe for Dr. Wyvonnia Lora.   .I have reviewed the above documentation for accuracy and completeness, and I agree with the above. Johney Maine MD

## 2019-06-23 ENCOUNTER — Inpatient Hospital Stay: Payer: 59 | Attending: Hematology | Admitting: Hematology

## 2019-06-23 ENCOUNTER — Telehealth: Payer: Self-pay | Admitting: Hematology

## 2019-06-23 ENCOUNTER — Other Ambulatory Visit: Payer: Self-pay

## 2019-06-23 ENCOUNTER — Inpatient Hospital Stay: Payer: 59

## 2019-06-23 VITALS — BP 128/86 | HR 83 | Temp 98.2°F | Resp 18 | Ht 68.0 in | Wt 236.7 lb

## 2019-06-23 DIAGNOSIS — Z79899 Other long term (current) drug therapy: Secondary | ICD-10-CM | POA: Diagnosis not present

## 2019-06-23 DIAGNOSIS — I2699 Other pulmonary embolism without acute cor pulmonale: Secondary | ICD-10-CM | POA: Insufficient documentation

## 2019-06-23 DIAGNOSIS — I10 Essential (primary) hypertension: Secondary | ICD-10-CM | POA: Diagnosis not present

## 2019-06-23 DIAGNOSIS — Z79890 Hormone replacement therapy (postmenopausal): Secondary | ICD-10-CM | POA: Insufficient documentation

## 2019-06-23 DIAGNOSIS — D6859 Other primary thrombophilia: Secondary | ICD-10-CM

## 2019-06-23 DIAGNOSIS — I82401 Acute embolism and thrombosis of unspecified deep veins of right lower extremity: Secondary | ICD-10-CM | POA: Insufficient documentation

## 2019-06-23 DIAGNOSIS — E291 Testicular hypofunction: Secondary | ICD-10-CM | POA: Diagnosis not present

## 2019-06-23 DIAGNOSIS — Z833 Family history of diabetes mellitus: Secondary | ICD-10-CM | POA: Diagnosis not present

## 2019-06-23 DIAGNOSIS — Z7901 Long term (current) use of anticoagulants: Secondary | ICD-10-CM | POA: Diagnosis not present

## 2019-06-23 DIAGNOSIS — E669 Obesity, unspecified: Secondary | ICD-10-CM | POA: Diagnosis not present

## 2019-06-23 DIAGNOSIS — I82411 Acute embolism and thrombosis of right femoral vein: Secondary | ICD-10-CM

## 2019-06-23 DIAGNOSIS — Z8249 Family history of ischemic heart disease and other diseases of the circulatory system: Secondary | ICD-10-CM | POA: Diagnosis not present

## 2019-06-23 LAB — CBC WITH DIFFERENTIAL/PLATELET
Abs Immature Granulocytes: 0.02 10*3/uL (ref 0.00–0.07)
Basophils Absolute: 0.1 10*3/uL (ref 0.0–0.1)
Basophils Relative: 2 %
Eosinophils Absolute: 0.1 10*3/uL (ref 0.0–0.5)
Eosinophils Relative: 2 %
HCT: 45.1 % (ref 39.0–52.0)
Hemoglobin: 14.8 g/dL (ref 13.0–17.0)
Immature Granulocytes: 0 %
Lymphocytes Relative: 31 %
Lymphs Abs: 1.6 10*3/uL (ref 0.7–4.0)
MCH: 29.7 pg (ref 26.0–34.0)
MCHC: 32.8 g/dL (ref 30.0–36.0)
MCV: 90.4 fL (ref 80.0–100.0)
Monocytes Absolute: 0.3 10*3/uL (ref 0.1–1.0)
Monocytes Relative: 6 %
Neutro Abs: 3 10*3/uL (ref 1.7–7.7)
Neutrophils Relative %: 59 %
Platelets: 248 10*3/uL (ref 150–400)
RBC: 4.99 MIL/uL (ref 4.22–5.81)
RDW: 13.4 % (ref 11.5–15.5)
WBC: 5.1 10*3/uL (ref 4.0–10.5)
nRBC: 0 % (ref 0.0–0.2)

## 2019-06-23 LAB — CMP (CANCER CENTER ONLY)
ALT: 49 U/L — ABNORMAL HIGH (ref 0–44)
AST: 24 U/L (ref 15–41)
Albumin: 4 g/dL (ref 3.5–5.0)
Alkaline Phosphatase: 49 U/L (ref 38–126)
Anion gap: 9 (ref 5–15)
BUN: 18 mg/dL (ref 6–20)
CO2: 26 mmol/L (ref 22–32)
Calcium: 8.7 mg/dL — ABNORMAL LOW (ref 8.9–10.3)
Chloride: 106 mmol/L (ref 98–111)
Creatinine: 1.25 mg/dL — ABNORMAL HIGH (ref 0.61–1.24)
GFR, Est AFR Am: >60 mL/min (ref >60)
GFR, Estimated: >60 mL/min (ref >60)
Glucose, Bld: 95 mg/dL (ref 70–99)
Potassium: 4.1 mmol/L (ref 3.5–5.1)
Sodium: 141 mmol/L (ref 135–145)
Total Bilirubin: 0.5 mg/dL (ref 0.3–1.2)
Total Protein: 7 g/dL (ref 6.5–8.1)

## 2019-06-23 LAB — ANTITHROMBIN III: AntiThromb III Func: 105 % (ref 75–120)

## 2019-06-23 NOTE — Patient Instructions (Signed)
Thank you for choosing [facility] to provide your oncology and hematology care.   Should you have questions after your visit to the [facility] ([facility]), please contact this office at [phone] between 8:30 AM and 4:30 PM.  Voice mails left after 4:00 PM may not be returned until the following business day.  Calls received after 4:30 PM will be answered by an off-site Nurse Triage Line.    Prescription Refills:  Please have your pharmacy contact us directly for most prescription requests.  Contact the office directly for refills of narcotics (pain medications). Allow 48-72 hours for refills.  Appointments: Please contact the [facility] scheduling department [phone] for questions regarding [facility] appointment scheduling.  Contact the schedulers with any scheduling changes so that your appointment can be rescheduled in a timely manner.   Central Scheduling for [place] [phone] - Call to schedule procedures such as PET scans, CT scans, MRI, Ultrasound, etc.  To afford each patient quality time with our providers, please arrive 30 minutes before your scheduled appointment time.  If you arrive late for your appointment, you may be asked to reschedule.  We strive to give you quality time with our providers, and arriving late affects you and other patients whose appointments are after yours. If you are a no show for multiple scheduled visits, you may be dismissed from the clinic at the providers discretion.     Resources: [facility] Social Workers [phone] for additional information on assistance programs --Anne Cunningham/Abigail Elmore  [place] DSS  [phone]: Information regarding food stamps, Medicaid, and utility assistance [employer] [phone]    [employer]'s shared-ride transportation service for eligible riders who have a disability that prevents them from riding the fixed route bus.   Medicare Rights Center 800-333-4114 Helps people with  Medicare understand their rights and benefits, navigate the Medicare system, and secure the quality healthcare they deserve [employer] 800-227-2345 Assists patients locate various types of support and financial assistance Cancer Care: 1-800-813-HOPE (4673) Provides financial assistance, online support groups, medication/co-pay assistance.   Transportation Assistance for appointments at [facility]: Transportation Coordinator [phone]  Again, thank you for choosing [facility] for your care.       

## 2019-06-23 NOTE — Telephone Encounter (Signed)
Scheduled appt per 1/6 los - pt aware of appts

## 2019-06-24 LAB — PROTEIN S ACTIVITY: Protein S Activity: 79 % (ref 63–140)

## 2019-06-24 LAB — PROTEIN S, TOTAL: Protein S Ag, Total: 84 % (ref 60–150)

## 2019-06-24 LAB — LUPUS ANTICOAGULANT PANEL
DRVVT: 48.2 s — ABNORMAL HIGH (ref 0.0–47.0)
PTT Lupus Anticoagulant: 43.2 s (ref 0.0–51.9)

## 2019-06-24 LAB — DRVVT MIX: dRVVT Mix: 40.2 s (ref 0.0–40.4)

## 2019-06-24 LAB — PROTEIN C ACTIVITY: Protein C Activity: 117 % (ref 73–180)

## 2019-06-25 ENCOUNTER — Encounter: Payer: Self-pay | Admitting: Hematology

## 2019-06-25 LAB — CARDIOLIPIN ANTIBODIES, IGG, IGM, IGA
Anticardiolipin IgA: <9 APL U/mL (ref 0–11)
Anticardiolipin IgG: 26 GPL U/mL — ABNORMAL HIGH (ref 0–14)
Anticardiolipin IgM: <9 MPL U/mL (ref 0–12)

## 2019-06-25 LAB — BETA-2-GLYCOPROTEIN I ABS, IGG/M/A
Beta-2 Glyco I IgG: <9 GPI IgG units (ref 0–20)
Beta-2-Glycoprotein I IgA: <9 GPI IgA units (ref 0–25)
Beta-2-Glycoprotein I IgM: <9 GPI IgM units (ref 0–32)

## 2019-06-25 LAB — PROTEIN C, TOTAL: Protein C, Total: 98 % (ref 60–150)

## 2019-06-25 LAB — HOMOCYSTEINE: Homocysteine: 11.8 umol/L (ref 0.0–14.5)

## 2019-06-28 LAB — PROTHROMBIN GENE MUTATION

## 2019-06-28 LAB — FACTOR 5 LEIDEN

## 2019-07-06 ENCOUNTER — Telehealth: Payer: Self-pay | Admitting: Hematology

## 2019-07-06 NOTE — Progress Notes (Signed)
HEMATOLOGY/ONCOLOGY CLINIC NOTE  Date of Service: 07/07/2019  Patient Care Team: Seward Carol, MD as PCP - General (Internal Medicine)  CHIEF COMPLAINTS/PURPOSE OF CONSULTATION:  B/L PE and DVT  HISTORY OF PRESENTING ILLNESS:   Ricky Bentley is a wonderful 54 y.o. male who has been referred to [place] after a hospital visit for evaluation and management of DVT and b/l Pulmonary Embolism. The pt reports that he is doing well overall.   The pt reports pt had a history of high blood pressure but began working out and eating healthier recently. Pt was using HCTZ and began receiving testosterone injections about 6 months before DVT/PE. His testosterone injections were given by his PCP, Dr. Delfina Redwood every month. His last injection was near the end of November. He began receiving the injections due to a lack of energy and sexual drive. He did feel an increase in energy but believes that this was more likely due to beginning a workout regimen and eating better foods. Pt's only previous surgery was a b/l Achilles Tendon Surgery. Pt was otherwise in great health.   He was feeling normal up until 2 weeks before his hospital admittance. Pt began to notice some SOB walking up stairs and around his home. He was concerned that he had Covid-19 due to respiratory symptoms but his test came back negative. Pt was monitoring his oxygen saturation with a pulse oximeter at home and his levels were fine while resting but did drop with activity. He began to have a cough the Wednesday night before his hospital admittance. He was admitted to the hospital on 06/03/2019. After his pulmonary artery catheter-directed thrombolysis pt felt immediately better and has been improving since. Pt has gotten back to walking with his dog but is not doing more strenuous exercise at this time. He denies any pain or swelling in his right leg. Pt did not do any long-term travelling (>4 hours ) prior to his DVT/PE.   Pt does not smoke  cigarettes and does not drink much alcohol. Pt had no previous blood clots and denies blood clotting or bleeding disorders in his family. His brother has also had a blood clot but has many other health problems that could have contributed to this. He has had no known kidney disorders.   Of note prior to the patient's visit today, pt has had VAS [place] Lower Extremity Venous Bilat (DVT) completed on 06/04/2019 with results revealing "Right: Findings consistent with acute deep vein thrombosis involving the right femoral vein, right popliteal vein, and right peroneal veins. No cystic structure found in the popliteal fossa. Left: There is no evidence of deep vein thrombosis in the lower extremity. No cystic structure found in the popliteal fossa."   Pt has had CT Angio Chest PE ([id number]) completed on 06/03/2019 with results revealing "1. Extensive bilateral pulmonary embolus with pulmonary emboli extending from the main pulmonary outflow tracks throughout the upper and lower lobe pulmonary artery systems bilaterally. Positive for acute PE with CT evidence of right heart strain (RV/LV Ratio = 2.3) consistent with at least submassive (intermediate risk) PE. The presence of right heart strain has been associated with an increased risk of morbidity and mortality. Please activate Code PE by paging [phone]. 2. Extensive airspace consolidation consistent with pneumonia in portions of the left upper and left lower lobes. A small amount of infiltrate is noted in the superior segment right lower lobe. A degree of intermingled pulmonary infarct cannot be excluded by CT. 3. Reflux of  contrast into the inferior vena cava and hepatic veins, a finding indicative of increased right heart pressure. 4.  Small pericardial effusion. 5.  No thoracic aortic aneurysm or dissection. 6.  No appreciable adenopathy."  Most recent lab results (06/07/2019) of CBC is as follows: all values are WNL.  On review of systems, pt denies  unexpected weight loss, bowel movement changes, fevers, chills, night sweats, SOB, cough, abdominal pain, leg swelling/pain, thigh pain and any other symptoms.   On PMHx the pt reports HTN, Achilles Tendon Surgery. On Social Hx the pt reports that he is a non-smoker and does not drink much alcohol On Family Hx the pt reports brother with a previous blood clot (other risk factors involved)   INTERVAL HISTORY:   I connected with  Ricky Bentley on 07/07/19 by telephone and verified that I am speaking with the correct person using two identifiers.   I discussed the limitations of evaluation and management by telemedicine. The patient expressed understanding and agreed to proceed.  Other persons participating in the visit and their role in the encounter:      -Carollee Herter, Medical Scribe     -Mrs. Jasmine December, Pt's wife  Patient's location: Home Provider's location: [facility] at [employer] is a wonderful male here for evaluation and management of DVT and b/l Pulmonary Embolism. The patient's last visit with [place] was on 06/23/2019. The pt reports that he is doing well overall.  The pt reports that he has been feeling well and has had no breathing issues. He denies any leg swelling or leg pain, except for some pain in the area of his Achilles tendon. Pt has a previous injury in the area. He has started wearing his boot, icing the area and giving it some rest.   Lab results (06/23/19) of CBC w/diff and CMP is as follows: all values are WNL except for Creatinine at 1.25, Calcium at 8.7, ALT at 49. 06/23/2019 Homocysteine at 11.8 06/23/2019 Antithrombin III at 105 06/23/2019 Protein C activity at 117 06/23/2019 Protein C total at 98 06/23/2019 Protein S activity at 79 06/23/2019 Protein S total at 84 06/23/2019 dRVVT Mix at 40.2 06/23/2019 Factor 5 leiden is "Negative" 06/23/2019 PTT Lupus Anticoagulant at 43.2, DRVVT at 48.2 06/23/2019 Prothrombin Gene mutation shows "SINGLE  [id number] MUTATION IDENTIFIED (HETEROZYGOTE)" 06/23/2019 Cardiolipin antibodies, IgG, IgM, IgA is as follows: Anticardiolipin IgG at 26, Anticardiolipin IgM at <9, Anticardiolipin IgA at <9 06/23/2019 Beta-2-glycoprotein i abs, IgG/M/A is as follows: Beta-2 Glyco I IgG at <9, Beta-2-Glycoprotein I IgM at <9, Beta-2-Glycoprotein I IgA at <9.  On review of systems, pt reports pain in Achilles tendon and denies SOB, chest pain, leg swelling, leg pain and any other symptoms.   MEDICAL HISTORY:  Past Medical History:  Diagnosis Date  . ADD (attention deficit disorder) 11/27/2011  . Hypertension 06/03/2019  . Hypogonadism male 04/26/2013  . Obesity (BMI 30.0-34.9) 06/03/2019    SURGICAL HISTORY: Past Surgical History:  Procedure Laterality Date  . ACHILLES TENDON SURGERY Bilateral   . IR ANGIOGRAM PULMONARY BILATERAL SELECTIVE  06/03/2019  . IR ANGIOGRAM SELECTIVE EACH ADDITIONAL VESSEL  06/03/2019  . IR ANGIOGRAM SELECTIVE EACH ADDITIONAL VESSEL  06/03/2019  . IR INFUSION THROMBOL ARTERIAL INITIAL (MS)  06/03/2019  . IR INFUSION THROMBOL ARTERIAL INITIAL (MS)  06/03/2019  . IR THROMB F/U EVAL ART/VEN FINAL DAY (MS)  06/04/2019  . IR US GUIDE VASC ACCESS RIGHT  06/03/2019    SOCIAL HISTORY: Social History  Socioeconomic History  . Marital status: Married    Spouse name: Not on file  . Number of children: Not on file  . Years of education: Not on file  . Highest education level: Not on file  Occupational History  . Not on file  Tobacco Use  . Smoking status: Never Smoker  . Smokeless tobacco: Never Used  Substance and Sexual Activity  . Alcohol use: Yes    Alcohol/week: 0.0 standard drinks  . Drug use: No  . Sexual activity: Yes  Other Topics Concern  . Not on file  Social History Narrative  . Not on file   Social Determinants of Health   Financial Resource Strain:   . Difficulty of Paying Living Expenses: Not on file  Food Insecurity:   . Worried About Patent examinerunning Out  of Food in the Last Year: Not on file  . Ran Out of Food in the Last Year: Not on file  Transportation Needs:   . Lack of Transportation (Medical): Not on file  . Lack of Transportation (Non-Medical): Not on file  Physical Activity:   . Days of Exercise per Week: Not on file  . Minutes of Exercise per Session: Not on file  Stress:   . Feeling of Stress : Not on file  Social Connections:   . Frequency of Communication with Friends and Family: Not on file  . Frequency of Social Gatherings with Friends and Family: Not on file  . Attends Religious Services: Not on file  . Active Member of Clubs or Organizations: Not on file  . Attends BankerClub or Organization Meetings: Not on file  . Marital Status: Not on file  Intimate Partner Violence:   . Fear of Current or Ex-Partner: Not on file  . Emotionally Abused: Not on file  . Physically Abused: Not on file  . Sexually Abused: Not on file    FAMILY HISTORY: Family History  Problem Relation Age of Onset  . Diabetes Mother   . Deep vein thrombosis Brother   . Diabetes Maternal Grandmother   . Heart disease Maternal Grandmother   . Diabetes Maternal Grandfather   . Cancer Paternal Grandmother        pancreatic cancer  . Heart disease Paternal Grandfather     ALLERGIES:  has No Known Allergies.  MEDICATIONS:  Current Outpatient Medications  Medication Sig Dispense Refill  . hydrochlorothiazide (HYDRODIURIL) 25 MG tablet Take 25 mg by mouth daily.    Marland Kitchen. apixaban (ELIQUIS) 5 MG TABS tablet 1 tab twice daily for lifelong. 60 tablet 5   No current facility-administered medications for this visit.    REVIEW OF SYSTEMS:   A 10+ POINT REVIEW OF SYSTEMS WAS OBTAINED including neurology, dermatology, psychiatry, cardiac, respiratory, lymph, extremities, GI, GU, Musculoskeletal, constitutional, breasts, reproductive, HEENT.  All pertinent positives are noted in the HPI.  All others are negative.   PHYSICAL EXAMINATION: ECOG PERFORMANCE  STATUS:  . There were no vitals filed for this visit. There were no vitals filed for this visit. .There is no height or weight on file to calculate BMI.   Telehealth visit  LABORATORY DATA:  I have reviewed the data as listed  . CBC Latest Ref Rng & Units 06/23/2019 06/07/2019 06/06/2019  WBC 4.0 - 10.5 K/uL 5.1 7.2 8.0  Hemoglobin 13.0 - 17.0 g/dL 69.614.8 29.514.6 28.414.3  Hematocrit 39.0 - 52.0 % 45.1 44.4 44.1  Platelets 150 - 400 K/uL 248 269 247    . CMP Latest Ref Rng &  Units 06/23/2019 06/04/2019 06/03/2019  Glucose 70 - 99 mg/dL 95 161(W) 960(A)  BUN 6 - 20 mg/dL 18 20 54(U)  Creatinine 0.61 - 1.24 mg/dL 9.81(X) 9.14(N) 8.29(F)  Sodium 135 - 145 mmol/L 141 138 137  Potassium 3.5 - 5.1 mmol/L 4.1 3.8 4.0  Chloride 98 - 111 mmol/L 106 105 103  CO2 22 - 32 mmol/L 26 21(L) 22  Calcium 8.9 - 10.3 mg/dL 6.2(Z) 3.0(Q) 9.1  Total Protein 6.5 - 8.1 g/dL 7.0 6.5(H) -  Total Bilirubin 0.3 - 1.2 mg/dL 0.5 1.2 -  Alkaline Phos 38 - 126 U/L 49 49 -  AST 15 - 41 U/L 24 22 -  ALT 0 - 44 U/L 49(H) 43 -     RADIOGRAPHIC STUDIES: I have personally reviewed the radiological images as listed and agreed with the findings in the report. No results found.  ASSESSMENT & PLAN:   54 yo with   1) Acute Rt LE DVT 06/04/2019 VAS [place] Lower Extremity Venous Bilat (DVT) revealed "Right: Findings consistent with acute deep vein thrombosis involving the right femoral vein, right popliteal vein, and right peroneal veins. No cystic structure found in the popliteal fossa. Left: There is no evidence of deep vein thrombosis in the lower extremity. No cystic structure found in the popliteal fossa."  2) Acute extensive b/l Pulmonary emboli with rt heart strain s/p cath directed lytics ?provoking event - testosterone use 06/03/2019 CT Angio Chest PE ([id number]) revealed "1. Extensive bilateral pulmonary embolus with pulmonary emboli extending from the main pulmonary outflow tracks throughout the upper and lower lobe  pulmonary artery systems bilaterally. Positive for acute PE with CT evidence of right heart strain (RV/LV Ratio = 2.3) consistent with at least submassive (intermediate risk) PE. " 3) Single Prothrombin gene mutation - Heterozygote  4) Elevated Anticardiolipin IgG - not retested   PLAN: -Discussed pt labwork, 06/23/19; blood counts and chemistries are stable  -Homocysteine, Antithrombin III, Protein C activity/total, Protein S activity/total, dRVVT Mix, Factor 5 Leiden, PTT Lupus Anticoagulation, Anticardiolipin IgM, Anticardiolipin IgA,  Beta-2 Glyco I IgG, Beta-2-Glycoprotein I IgM, Beta-2-Glycoprotein I IgA levels are all WNL -Anticardiolipin IgG is elevated at 26 - could be temporary but will need to r/o APLA syndrome -Pt has a Heterozygous Prothrombin Gene mutation -Advised pt that Heterozygote Prothrombin gene mutation carries a 3-4 fold life-long risk for venous blood clots  -Advised pt that increasing age is an additional risk factor for blood clots -Recommended pt discuss Heterozygous PT mutation with his family, consider testing in the future -Advised that if Anticardiolipin IgG remains elevated likely recommendation would be life-long blood thinners -Recommended pt avoid all testosterone replacement for life -Recommend pt continue to gradually increase aerobic activity -Recommend pt avoid long-distance travel as much as possible -Continue 5 mg Eliquis BID -Will get rpt ECHO in 12 weeks with labs -Will get rpt Anticardiolipin IgG in 12 weeks -Refill Eliquis -Will see back in 14 weeks    FOLLOW UP: Labs in 12 weeks Repeat ECHO in 12 weeks RTC with Dr. Candise Che in 14 weeks  The total time spent in the appt was 30 minutes and more than 50% was on counseling and direct patient cares.  All of the patient's questions were answered with apparent satisfaction. The patient knows to call the clinic with any problems, questions or concerns.    Wyvonnia Lora MD MS AAHIVMS [facility]  [facility] Hematology/Oncology Physician [facility]  (Office):       [phone] (Work cell):  [phone] (Fax):           [phone]  07/07/2019 4:37 PM  I, Carollee Herter, am acting as a scribe for Dr. Wyvonnia Lora.   .I have reviewed the above documentation for accuracy and completeness, and I agree with the above. Johney Maine MD

## 2019-07-07 ENCOUNTER — Inpatient Hospital Stay (HOSPITAL_BASED_OUTPATIENT_CLINIC_OR_DEPARTMENT_OTHER): Payer: 59 | Admitting: Hematology

## 2019-07-07 ENCOUNTER — Other Ambulatory Visit: Payer: Self-pay | Admitting: *Deleted

## 2019-07-07 DIAGNOSIS — R760 Raised antibody titer: Secondary | ICD-10-CM

## 2019-07-07 DIAGNOSIS — I2699 Other pulmonary embolism without acute cor pulmonale: Secondary | ICD-10-CM | POA: Diagnosis not present

## 2019-07-07 DIAGNOSIS — D6859 Other primary thrombophilia: Secondary | ICD-10-CM

## 2019-07-07 DIAGNOSIS — D6852 Prothrombin gene mutation: Secondary | ICD-10-CM

## 2019-07-07 DIAGNOSIS — I82411 Acute embolism and thrombosis of right femoral vein: Secondary | ICD-10-CM | POA: Diagnosis not present

## 2019-07-07 MED ORDER — APIXABAN 5 MG PO TABS: ORAL_TABLET | ORAL | 0 refills | Status: DC

## 2019-07-07 MED ORDER — APIXABAN 5 MG PO TABS: ORAL_TABLET | ORAL | 5 refills | Status: DC

## 2019-07-08 ENCOUNTER — Telehealth: Payer: Self-pay | Admitting: Hematology

## 2019-07-08 NOTE — Telephone Encounter (Signed)
Scheduled appt per 1/20 los.  Sent a message to HIM pool to get a calendar mailed out. 

## 2019-07-09 LAB — JAK2 (INCLUDING V617F AND EXON 12), MPL,& CALR-NEXT GEN SEQ

## 2019-07-13 ENCOUNTER — Other Ambulatory Visit: Payer: Self-pay

## 2019-07-13 ENCOUNTER — Encounter: Payer: Self-pay | Admitting: Critical Care Medicine

## 2019-07-13 ENCOUNTER — Ambulatory Visit (INDEPENDENT_AMBULATORY_CARE_PROVIDER_SITE_OTHER): Payer: 59 | Admitting: Critical Care Medicine

## 2019-07-13 VITALS — BP 132/90 | HR 65 | Temp 97.5°F | Ht 70.0 in | Wt 232.4 lb

## 2019-07-13 DIAGNOSIS — I2699 Other pulmonary embolism without acute cor pulmonale: Secondary | ICD-10-CM

## 2019-07-13 NOTE — Progress Notes (Signed)
Synopsis: Referred in December 2020 for submassive PE by Leandrew Koyanagi, MD.  Subjective:   PATIENT ID: Ricky Bentley GENDER: male DOB: 1965/11/25, MRN: [id number]  Chief Complaint  Patient presents with  . Hospitalization Follow-up    Patient is here for hospital follow up for bilateral PE 06/03/2019. Patient is on Eliquis. Patient feels good overall.     Ricky Bentley is a 54 y/o gentleman being seen in follow-up post hospitalization for submassive PE.  He was admitted to the hospital 12/17-12/21 after presenting with dyspnea on exertion, palpitations, hypoxia into the 80s.  He received catheter directed thrombolytics.  He was discharged on Eliquis.  No chest pain, shortness of breath, swelling, dizziness since discharge.  He has been exercising with walking, lifting light weights.  He has changed his diet and his continue to work on modest weight loss.  He has been doing well on Eliquis without issues with bleeding.  Prior to his hospitalization he had not had previous surgery, prolonged travel or immobility, history of VTE.  He has no personal history of cancer.  He has a brother had a DVT.  He was receiving testosterone injections for hypogonadism.  He has followed up with Dr. Irene Limbo in hematology, and was found to be heterozygous for the prothrombin gene mutation.  He had an elevated anticardiolipin antibody level, but will require ongoing follow-up. He is UTD on his flu shot.     Past Medical History:  Diagnosis Date  . ADD (attention deficit disorder) 11/27/2011  . Hypertension 06/03/2019  . Hypogonadism male 04/26/2013  . Obesity (BMI 30.0-34.9) 06/03/2019     Family History  Problem Relation Age of Onset  . Diabetes Mother   . Deep vein thrombosis Brother   . Diabetes Maternal Grandmother   . Heart disease Maternal Grandmother   . Diabetes Maternal Grandfather   . Cancer Paternal Grandmother        pancreatic cancer  . Heart disease Paternal Grandfather      Past  Surgical History:  Procedure Laterality Date  . ACHILLES TENDON SURGERY Bilateral   . IR ANGIOGRAM PULMONARY BILATERAL SELECTIVE  06/03/2019  . IR ANGIOGRAM SELECTIVE EACH ADDITIONAL VESSEL  06/03/2019  . IR ANGIOGRAM SELECTIVE EACH ADDITIONAL VESSEL  06/03/2019  . IR INFUSION THROMBOL ARTERIAL INITIAL (MS)  06/03/2019  . IR INFUSION THROMBOL ARTERIAL INITIAL (MS)  06/03/2019  . IR THROMB F/U EVAL ART/VEN FINAL DAY (MS)  06/04/2019  . IR US GUIDE VASC ACCESS RIGHT  06/03/2019    Social History   Socioeconomic History  . Marital status: Married    Spouse name: Not on file  . Number of children: Not on file  . Years of education: Not on file  . Highest education level: Not on file  Occupational History  . Not on file  Tobacco Use  . Smoking status: Never Smoker  . Smokeless tobacco: Never Used  Substance and Sexual Activity  . Alcohol use: Yes    Alcohol/week: 0.0 standard drinks  . Drug use: No  . Sexual activity: Yes  Other Topics Concern  . Not on file  Social History Narrative  . Not on file   Social Determinants of Health   Financial Resource Strain:   . Difficulty of Paying Living Expenses: Not on file  Food Insecurity:   . Worried About Charity fundraiser in the Last Year: Not on file  . Ran Out of Food in the Last Year: Not on file  Transportation  Needs:   . Lack of Transportation (Medical): Not on file  . Lack of Transportation (Non-Medical): Not on file  Physical Activity:   . Days of Exercise per Week: Not on file  . Minutes of Exercise per Session: Not on file  Stress:   . Feeling of Stress : Not on file  Social Connections:   . Frequency of Communication with Friends and Family: Not on file  . Frequency of Social Gatherings with Friends and Family: Not on file  . Attends Religious Services: Not on file  . Active Member of Clubs or Organizations: Not on file  . Attends Banker Meetings: Not on file  . Marital Status: Not on file    Intimate Partner Violence:   . Fear of Current or Ex-Partner: Not on file  . Emotionally Abused: Not on file  . Physically Abused: Not on file  . Sexually Abused: Not on file     No Known Allergies   Immunization History  Administered Date(s) Administered  . Influenza,inj,Quad PF,6+ Mos 07/28/2013, 07/27/2014, 02/22/2015  . Influenza,inj,Quad PF,6-35 Mos 04/12/2019    Outpatient Medications Prior to Visit  Medication Sig Dispense Refill  . apixaban (ELIQUIS) 5 MG TABS tablet 1 tab twice daily for lifelong. 60 tablet 5  . hydrochlorothiazide (HYDRODIURIL) 25 MG tablet Take 25 mg by mouth daily.     No facility-administered medications prior to visit.    Review of Systems  Constitutional:       Weight stable  Respiratory: Negative for shortness of breath.   Cardiovascular: Negative for chest pain and leg swelling.  Gastrointestinal: Negative for blood in stool.  Genitourinary: Negative for hematuria.  Neurological: Negative for focal weakness.  Endo/Heme/Allergies: Does not bruise/bleed easily.     Objective:   Vitals:   07/13/19 1334  BP: 132/90  Pulse: 65  Temp: (!) 97.5 F (36.4 C)  TempSrc: Temporal  SpO2: 98%  Weight: 232 lb 6.4 oz (105.4 kg)  Height: 5\' 10"  (1.778 m)   98% on   RA BMI Readings from Last 3 Encounters:  07/13/19 33.35 kg/m  06/23/19 35.99 kg/m  06/03/19 32.58 kg/m   Wt Readings from Last 3 Encounters:  07/13/19 232 lb 6.4 oz (105.4 kg)  06/23/19 236 lb 11.2 oz (107.4 kg)  06/03/19 227 lb 1.2 oz (103 kg)    Physical Exam Vitals reviewed.  Constitutional:      General: He is not in acute distress.    Appearance: Normal appearance. He is not ill-appearing.  HENT:     Head: Normocephalic and atraumatic.     Nose:     Comments: Deferred due to masking requirement.    Mouth/Throat:     Comments: Deferred due to masking requirement. Eyes:     General: No scleral icterus. Cardiovascular:     Rate and Rhythm: Normal rate and  regular rhythm.     Heart sounds: No murmur.  Pulmonary:     Comments: Breathing comfortably on room air, no conversational dyspnea.  Clear to auscultation bilaterally. Abdominal:     General: There is no distension.     Palpations: Abdomen is soft.     Tenderness: There is no abdominal tenderness.  Musculoskeletal:        General: No swelling or deformity.     Cervical back: Neck supple.  Lymphadenopathy:     Cervical: No cervical adenopathy.  Skin:    General: Skin is warm and dry.     Findings: No rash.  Neurological:     General: No focal deficit present.     Mental Status: He is alert.     Motor: No weakness.     Coordination: Coordination normal.  Psychiatric:        Mood and Affect: Mood normal.        Behavior: Behavior normal.      CBC    Component Value Date/Time   WBC 5.1 06/23/2019 1140   RBC 4.99 06/23/2019 1140   HGB 14.8 06/23/2019 1140   HCT 45.1 06/23/2019 1140   PLT 248 06/23/2019 1140   MCV 90.4 06/23/2019 1140   MCH 29.7 06/23/2019 1140   MCHC 32.8 06/23/2019 1140   RDW 13.4 06/23/2019 1140   LYMPHSABS 1.6 06/23/2019 1140   MONOABS 0.3 06/23/2019 1140   EOSABS 0.1 06/23/2019 1140   BASOSABS 0.1 06/23/2019 1140    CHEMISTRY No results for input(s): NA, K, CL, CO2, GLUCOSE, BUN, CREATININE, CALCIUM, MG, PHOS in the last 168 hours. Estimated Creatinine Clearance: 85 mL/min (A) (by C-G formula based on SCr of 1.25 mg/dL (H)).   Prothrombin prothrombin gene mutation-heterozygous Anti-cardiolipin IgG elevated DRVVT elevated Beta 2 glycoprotein WNL Protein C and protein S-WNL  Chest Imaging- films reviewed: CTA chest 06/03/2019- patchy peripheral opacities in the medial LLL and apical LUL.  Minimal opacity in the superior segment RLL.  Lateral PEs to all lobes.  Reflux of contrast on IVC into the liver.  06/04/2019 LE Doppler-DVT of the right femoral vein, popliteal vein, right peroneal veins. Normal left veins.  Pulmonary Functions Testing  Results: No flowsheet data found.   Echocardiogram 06/03/2019: LVEF 55 to 60%, hyperdynamic LV function, mild LVH.  Grade 1 diastolic dysfunction.  Normal LA.  Enlarged RV with moderately reduced systolic function.  Normal RA.  Trivial TR.  Severely elevated PASP.  Dilated IVC with minimal respiratory variability.  Catheter directed lytics 06/03/2019: FINDINGS: Post PE lysis protocol PA pressure: 50/16 (28) This is decreased from a pretreatment pressure of 76/20 (41)     Assessment & Plan:     ICD-10-CM   1. Bilateral pulmonary embolism (HCC)  I26.99 ECHOCARDIOGRAM COMPLETE   Unprovoked submassive PE with bilateral emboli, status post catheter directed thrombolytics.  Prothrombin gene mutation heterozygote. -Continue follow-up with Dr. Candise Che regarding hypercoagulability work-up/testing for anticardiolipin antibody -Continue Eliquis twice daily for minimum of 6 months; given his brother's history of DVT and his severe presentation with the known predisposing condition, he would likely benefit from lifelong anticoagulation -Follow-up echocardiogram in March -Age-appropriate cancer screening is needed; would prefer to defer colonoscopy until he has been anticoagulated for 6 months if possible.  RTC in 3 months.    Current Outpatient Medications:  .  apixaban (ELIQUIS) 5 MG TABS tablet, 1 tab twice daily for lifelong., Disp: 60 tablet, Rfl: 5 .  hydrochlorothiazide (HYDRODIURIL) 25 MG tablet, Take 25 mg by mouth daily., Disp: , Rfl:    I spent 45 minutes on this encounter, including face to face time and non-face to face time spent reviewing records, charting, coordinating care, etc.   Steffanie Dunn, DO [facility] 07/13/2019 2:32 PM

## 2019-07-13 NOTE — Patient Instructions (Addendum)
Thank you for visiting Dr. Chestine Spore at [facility]. We recommend the following: Orders Placed This Encounter  Procedures  . ECHOCARDIOGRAM COMPLETE   Orders Placed This Encounter  Procedures  . ECHOCARDIOGRAM COMPLETE    Standing Status:   Future    Standing Expiration Date:   10/10/2020    Scheduling Instructions:     Late March 2021    Order Specific Question:   Where should this test be performed    Answer:   [facility])    Order Specific Question:   Does the patient weigh less than or greater than 250 lbs?    Answer:   Patient weighs less than 250 lbs    Order Specific Question:   Perflutren DEFINITY (image enhancing agent) should be administered unless hypersensitivity or allergy exist    Answer:   Administer Perflutren    Order Specific Question:   Reason for exam-Echo    Answer:   Pulmonary Embolus  415.19 / I26.99      Return in about 3 months (around 10/11/2019).    Please do your part to reduce the spread of COVID-19.

## 2019-08-19 ENCOUNTER — Ambulatory Visit: Payer: 59 | Attending: Family

## 2019-08-19 DIAGNOSIS — Z23 Encounter for immunization: Secondary | ICD-10-CM

## 2019-08-19 NOTE — Progress Notes (Signed)
   Covid-19 Vaccination Clinic  Name:  Ricky Bentley    MRN: [id number] DOB: [birth year]  08/19/2019  [name] was observed post Covid-19 immunization for 15 minutes without incident. He was provided with Vaccine Information Sheet and instruction to access the V-Safe system.   [name] was instructed to call 911 with any severe reactions post vaccine: Marland Kitchen Difficulty breathing  . Swelling of face and throat  . A fast heartbeat  . A bad rash all over body  . Dizziness and weakness   Immunizations Administered    Name Date Dose VIS Date Route   Moderna COVID-19 Vaccine 08/19/2019  2:41 PM 0.5 mL 05/18/2019 Intramuscular   Manufacturer: Moderna   Lot: [id number]   NDC: 33174-099-27

## 2019-09-06 ENCOUNTER — Ambulatory Visit (HOSPITAL_COMMUNITY): Payer: 59 | Attending: Cardiovascular Disease

## 2019-09-06 ENCOUNTER — Other Ambulatory Visit: Payer: Self-pay

## 2019-09-06 DIAGNOSIS — I2699 Other pulmonary embolism without acute cor pulmonale: Secondary | ICD-10-CM | POA: Diagnosis present

## 2019-09-21 ENCOUNTER — Ambulatory Visit: Payer: 59 | Attending: Family

## 2019-09-21 DIAGNOSIS — Z23 Encounter for immunization: Secondary | ICD-10-CM

## 2019-09-21 NOTE — Progress Notes (Signed)
   Covid-19 Vaccination Clinic  Name:  RAJENDRA SPILLER    MRN: [id number] DOB: Feb 14, 1966  09/21/2019  Mr. Hicks was observed post Covid-19 immunization for 15 minutes without incident. He was provided with Vaccine Information Sheet and instruction to access the V-Safe system.   Mr. Gnau was instructed to call 911 with any severe reactions post vaccine: Marland Kitchen Difficulty breathing  . Swelling of face and throat  . A fast heartbeat  . A bad rash all over body  . Dizziness and weakness   Immunizations Administered    Name Date Dose VIS Date Route   Moderna COVID-19 Vaccine 09/21/2019 12:58 PM 0.5 mL 05/18/2019 Intramuscular   Manufacturer: Moderna   Lot: [id number]   NDC: 78478-412-82

## 2019-09-28 ENCOUNTER — Other Ambulatory Visit: Payer: Self-pay | Admitting: *Deleted

## 2019-09-28 DIAGNOSIS — D6859 Other primary thrombophilia: Secondary | ICD-10-CM

## 2019-09-28 DIAGNOSIS — D6852 Prothrombin gene mutation: Secondary | ICD-10-CM

## 2019-09-28 DIAGNOSIS — R760 Raised antibody titer: Secondary | ICD-10-CM

## 2019-09-29 ENCOUNTER — Inpatient Hospital Stay: Payer: 59 | Attending: Hematology

## 2019-09-29 ENCOUNTER — Other Ambulatory Visit: Payer: Self-pay

## 2019-09-29 DIAGNOSIS — Z7901 Long term (current) use of anticoagulants: Secondary | ICD-10-CM | POA: Diagnosis not present

## 2019-09-29 DIAGNOSIS — R760 Raised antibody titer: Secondary | ICD-10-CM

## 2019-09-29 DIAGNOSIS — Z86718 Personal history of other venous thrombosis and embolism: Secondary | ICD-10-CM | POA: Diagnosis not present

## 2019-09-29 DIAGNOSIS — Z86711 Personal history of pulmonary embolism: Secondary | ICD-10-CM | POA: Diagnosis present

## 2019-09-29 DIAGNOSIS — D6859 Other primary thrombophilia: Secondary | ICD-10-CM

## 2019-09-29 DIAGNOSIS — D6852 Prothrombin gene mutation: Secondary | ICD-10-CM

## 2019-09-29 LAB — CBC WITH DIFFERENTIAL (CANCER CENTER ONLY)
Abs Immature Granulocytes: 0.09 10*3/uL — ABNORMAL HIGH (ref 0.00–0.07)
Basophils Absolute: 0.1 10*3/uL (ref 0.0–0.1)
Basophils Relative: 2 %
Eosinophils Absolute: 0.2 10*3/uL (ref 0.0–0.5)
Eosinophils Relative: 4 %
HCT: 46.3 % (ref 39.0–52.0)
Hemoglobin: 15.2 g/dL (ref 13.0–17.0)
Immature Granulocytes: 1 %
Lymphocytes Relative: 33 %
Lymphs Abs: 2.1 10*3/uL (ref 0.7–4.0)
MCH: 29.2 pg (ref 26.0–34.0)
MCHC: 32.8 g/dL (ref 30.0–36.0)
MCV: 89 fL (ref 80.0–100.0)
Monocytes Absolute: 0.5 10*3/uL (ref 0.1–1.0)
Monocytes Relative: 8 %
Neutro Abs: 3.5 10*3/uL (ref 1.7–7.7)
Neutrophils Relative %: 52 %
Platelet Count: 286 10*3/uL (ref 150–400)
RBC: 5.2 MIL/uL (ref 4.22–5.81)
RDW: 13.9 % (ref 11.5–15.5)
WBC Count: 6.5 10*3/uL (ref 4.0–10.5)
nRBC: 0 % (ref 0.0–0.2)

## 2019-10-13 ENCOUNTER — Inpatient Hospital Stay: Payer: 59 | Admitting: Hematology

## 2019-11-08 ENCOUNTER — Telehealth: Payer: Self-pay | Admitting: *Deleted

## 2019-11-08 NOTE — Telephone Encounter (Signed)
Patient called and [facility] sent fax: Due to change in insurance, Eliquis needs Prior Authorization before being filled. Faxed PA request form given to [facility] staff member responsible for obtaining Prior Auths.

## 2020-01-03 ENCOUNTER — Telehealth: Payer: Self-pay | Admitting: Hematology

## 2020-01-03 ENCOUNTER — Other Ambulatory Visit: Payer: Self-pay | Admitting: Hematology

## 2020-01-03 NOTE — Telephone Encounter (Signed)
Called pt per 7/19 sch msg - vmail full, no answer. Mailed letter with appt date and time

## 2020-01-03 NOTE — Telephone Encounter (Signed)
Please review for refill - patient last appt 07/07/19. Did not come to appt on 10/13/19. Schedule message sent for follow up appt

## 2020-01-05 ENCOUNTER — Other Ambulatory Visit: Payer: Self-pay | Admitting: Hematology

## 2020-01-05 NOTE — Telephone Encounter (Signed)
He needs to f/u with PCP for continued refills of anticoagulant since he has not maintenance f/u with [place]. thx

## 2020-01-06 ENCOUNTER — Telehealth: Payer: Self-pay | Admitting: *Deleted

## 2020-01-06 NOTE — Telephone Encounter (Signed)
Contacted patient following refill request for Eliquis. Patient missed appt in April and has not been seen since January. Has appt in August. Dr. Candise Che has refilled Eliquis and requested patient be reminded of appt in August as he needs to have labs and see MD. Patient verbalized understanding

## 2020-01-31 ENCOUNTER — Other Ambulatory Visit: Payer: Self-pay | Admitting: *Deleted

## 2020-01-31 DIAGNOSIS — D6859 Other primary thrombophilia: Secondary | ICD-10-CM

## 2020-01-31 DIAGNOSIS — D6852 Prothrombin gene mutation: Secondary | ICD-10-CM

## 2020-01-31 NOTE — Progress Notes (Signed)
HEMATOLOGY/ONCOLOGY CLINIC NOTE  Date of Service: 02/01/2020  Patient Care Team: Renford Dills, MD as PCP - General (Internal Medicine)  CHIEF COMPLAINTS/PURPOSE OF CONSULTATION:  B/L PE and DVT  HISTORY OF PRESENTING ILLNESS:   Ricky Bentley is a wonderful 54 y.o. male who has been referred to [place] after a hospital visit for evaluation and management of DVT and b/l Pulmonary Embolism. The pt reports that he is doing well overall.   The pt reports pt had a history of high blood pressure but began working out and eating healthier recently. Pt was using HCTZ and began receiving testosterone injections about 6 months before DVT/PE. His testosterone injections were given by his PCP, Dr. Nehemiah Settle every month. His last injection was near the end of November. He began receiving the injections due to a lack of energy and sexual drive. He did feel an increase in energy but believes that this was more likely due to beginning a workout regimen and eating better foods. Pt's only previous surgery was a b/l Achilles Tendon Surgery. Pt was otherwise in great health.   He was feeling normal up until 2 weeks before his hospital admittance. Pt began to notice some SOB walking up stairs and around his home. He was concerned that he had Covid-19 due to respiratory symptoms but his test came back negative. Pt was monitoring his oxygen saturation with a pulse oximeter at home and his levels were fine while resting but did drop with activity. He began to have a cough the Wednesday night before his hospital admittance. He was admitted to the hospital on 06/03/2019. After his pulmonary artery catheter-directed thrombolysis pt felt immediately better and has been improving since. Pt has gotten back to walking with his dog but is not doing more strenuous exercise at this time. He denies any pain or swelling in his right leg. Pt did not do any long-term travelling (>4 hours ) prior to his DVT/PE.   Pt does not smoke  cigarettes and does not drink much alcohol. Pt had no previous blood clots and denies blood clotting or bleeding disorders in his family. His brother has also had a blood clot but has many other health problems that could have contributed to this. He has had no known kidney disorders.   Of note prior to the patient's visit today, pt has had VAS [place] Lower Extremity Venous Bilat (DVT) completed on 06/04/2019 with results revealing "Right: Findings consistent with acute deep vein thrombosis involving the right femoral vein, right popliteal vein, and right peroneal veins. No cystic structure found in the popliteal fossa. Left: There is no evidence of deep vein thrombosis in the lower extremity. No cystic structure found in the popliteal fossa."   Pt has had CT Angio Chest PE ([id number]) completed on 06/03/2019 with results revealing "1. Extensive bilateral pulmonary embolus with pulmonary emboli extending from the main pulmonary outflow tracks throughout the upper and lower lobe pulmonary artery systems bilaterally. Positive for acute PE with CT evidence of right heart strain (RV/LV Ratio = 2.3) consistent with at least submassive (intermediate risk) PE. The presence of right heart strain has been associated with an increased risk of morbidity and mortality. Please activate Code PE by paging [phone]. 2. Extensive airspace consolidation consistent with pneumonia in portions of the left upper and left lower lobes. A small amount of infiltrate is noted in the superior segment right lower lobe. A degree of intermingled pulmonary infarct cannot be excluded by CT. 3. Reflux of  contrast into the inferior vena cava and hepatic veins, a finding indicative of increased right heart pressure. 4.  Small pericardial effusion. 5.  No thoracic aortic aneurysm or dissection. 6.  No appreciable adenopathy."  Most recent lab results (06/07/2019) of CBC is as follows: all values are WNL.  On review of systems, pt denies  unexpected weight loss, bowel movement changes, fevers, chills, night sweats, SOB, cough, abdominal pain, leg swelling/pain, thigh pain and any other symptoms.   On PMHx the pt reports HTN, Achilles Tendon Surgery. On Social Hx the pt reports that he is a non-smoker and does not drink much alcohol On Family Hx the pt reports brother with a previous blood clot (other risk factors involved)   INTERVAL HISTORY: Ricky Bentley is a wonderful male here for evaluation and management of DVT and b/l Pulmonary Embolism. The patient's last visit with [place] was on 07/07/2019. The pt reports that he is doing well overall.  The pt reports that he continues walking for activity and is not doing more strenuous exercise at this time. He denies any bleeding concerns or any other issues with his blood thinners. Pt continues to be off of Testosterone therapy.    Of note since the patient's last visit, pt has had ECHO ([id number]) completed on 09/06/2019 with results revealing "1. Left ventricular ejection fraction, by estimation, is 60 to 65%. The left ventricle has normal function. The left ventricle has no regional wall motion abnormalities. Left ventricular diastolic parameters were normal. The average left ventricular global longitudinal strain is - 20.9 %. 2. Right ventricular systolic function is normal. The right ventricular size is normal. There is normal pulmonary artery systolic pressure. 3. The mitral valve is normal in structure. Trivial mitral valve regurgitation. No evidence of mitral stenosis. 4. The aortic valve is normal in structure. Aortic valve regurgitation is not visualized. No aortic stenosis is present."  Lab results today (02/01/20) of CBC w/diff and CMP is as follows: all values are WNL except for Glucose at 112, Creatinine at 1.50, AST at 13.  On review of systems, pt denies SOB, chest pain, leg swelling, abnormal/excessive bleeding, abdominal pain and any other symptoms.   MEDICAL HISTORY:   Past Medical History:  Diagnosis Date  . ADD (attention deficit disorder) 11/27/2011  . Hypertension 06/03/2019  . Hypogonadism male 04/26/2013  . Obesity (BMI 30.0-34.9) 06/03/2019    SURGICAL HISTORY: Past Surgical History:  Procedure Laterality Date  . ACHILLES TENDON SURGERY Bilateral   . IR ANGIOGRAM PULMONARY BILATERAL SELECTIVE  06/03/2019  . IR ANGIOGRAM SELECTIVE EACH ADDITIONAL VESSEL  06/03/2019  . IR ANGIOGRAM SELECTIVE EACH ADDITIONAL VESSEL  06/03/2019  . IR INFUSION THROMBOL ARTERIAL INITIAL (MS)  06/03/2019  . IR INFUSION THROMBOL ARTERIAL INITIAL (MS)  06/03/2019  . IR THROMB F/U EVAL ART/VEN FINAL DAY (MS)  06/04/2019  . IR US GUIDE VASC ACCESS RIGHT  06/03/2019    SOCIAL HISTORY: Social History   Socioeconomic History  . Marital status: Married    Spouse name: Not on file  . Number of children: Not on file  . Years of education: Not on file  . Highest education level: Not on file  Occupational History  . Not on file  Tobacco Use  . Smoking status: Never Smoker  . Smokeless tobacco: Never Used  Vaping Use  . Vaping Use: Never used  Substance and Sexual Activity  . Alcohol use: Yes    Alcohol/week: 0.0 standard drinks  . Drug use: No  .  Sexual activity: Yes  Other Topics Concern  . Not on file  Social History Narrative  . Not on file   Social Determinants of Health   Financial Resource Strain:   . Difficulty of Paying Living Expenses:   Food Insecurity:   . Worried About Programme researcher, broadcasting/film/videounning Out of Food in the Last Year:   . Baristaan Out of Food in the Last Year:   Transportation Needs:   . Freight forwarderLack of Transportation (Medical):   Marland Kitchen. Lack of Transportation (Non-Medical):   Physical Activity:   . Days of Exercise per Week:   . Minutes of Exercise per Session:   Stress:   . Feeling of Stress :   Social Connections:   . Frequency of Communication with Friends and Family:   . Frequency of Social Gatherings with Friends and Family:   . Attends Religious Services:    . Active Member of Clubs or Organizations:   . Attends BankerClub or Organization Meetings:   Marland Kitchen. Marital Status:   Intimate Partner Violence:   . Fear of Current or Ex-Partner:   . Emotionally Abused:   Marland Kitchen. Physically Abused:   . Sexually Abused:     FAMILY HISTORY: Family History  Problem Relation Age of Onset  . Diabetes Mother   . Deep vein thrombosis Brother   . Diabetes Maternal Grandmother   . Heart disease Maternal Grandmother   . Diabetes Maternal Grandfather   . Cancer Paternal Grandmother        pancreatic cancer  . Heart disease Paternal Grandfather     ALLERGIES:  has No Known Allergies.  MEDICATIONS:  Current Outpatient Medications  Medication Sig Dispense Refill  . ELIQUIS 5 MG TABS tablet TAKE 1 TABLET BY MOUTH TWICE DAILY FOR LIFELONG 60 tablet 5  . hydrochlorothiazide (HYDRODIURIL) 25 MG tablet Take 25 mg by mouth daily.     No current facility-administered medications for this visit.    REVIEW OF SYSTEMS:   A 10+ POINT REVIEW OF SYSTEMS WAS OBTAINED including neurology, dermatology, psychiatry, cardiac, respiratory, lymph, extremities, GI, GU, Musculoskeletal, constitutional, breasts, reproductive, HEENT.  All pertinent positives are noted in the HPI.  All others are negative.   PHYSICAL EXAMINATION: ECOG PERFORMANCE STATUS:  . Vitals:   02/01/20 0901  BP: (!) 139/98  Pulse: 69  Resp: 18  Temp: (!) 97.1 F (36.2 C)  SpO2: 99%   Filed Weights   02/01/20 0901  Weight: 247 lb 1.6 oz (112.1 kg)   .Body mass index is 35.46 kg/m.   GENERAL:alert, in no acute distress and comfortable SKIN: no acute rashes, no significant lesions EYES: conjunctiva are pink and non-injected, sclera anicteric OROPHARYNX: MMM, no exudates, no oropharyngeal erythema or ulceration NECK: supple, no JVD LYMPH:  no palpable lymphadenopathy in the cervical, axillary or inguinal regions LUNGS: clear to auscultation b/l with normal respiratory effort HEART: regular rate &  rhythm ABDOMEN:  normoactive bowel sounds , non tender, not distended. No palpable hepatosplenomegaly.  Extremity: no pedal edema PSYCH: alert & oriented x 3 with fluent speech NEURO: no focal motor/sensory deficits  LABORATORY DATA:  I have reviewed the data as listed  . CBC Latest Ref Rng & Units 02/01/2020 09/29/2019 06/23/2019  WBC 4.0 - 10.5 K/uL 7.2 6.5 5.1  Hemoglobin 13.0 - 17.0 g/dL 54.015.8 98.115.2 19.114.8  Hematocrit 39 - 52 % 47.4 46.3 45.1  Platelets 150 - 400 K/uL 250 286 248    . CMP Latest Ref Rng & Units 02/01/2020 06/23/2019 06/04/2019  Glucose 70 -  99 mg/dL 098(J) 95 191(Y)  BUN 6 - 20 mg/dL Creatinine 0.61 - 1.24 mg/dL 7.82(N) 5.62(Z) 3.08(M)  Sodium 135 - 145 mmol/L 138 141 138  Potassium 3.5 - 5.1 mmol/L 4.0 4.1 3.8  Chloride 98 - 111 mmol/L 106 106 105  CO2 22 - 32 mmol/L 24 26 21(L)  Calcium 8.9 - 10.3 mg/dL 9.8 5.7(Q) 4.6(N)  Total Protein 6.5 - 8.1 g/dL 7.6 7.0 6.2(X)  Total Bilirubin 0.3 - 1.2 mg/dL 0.5 0.5 1.2  Alkaline Phos 38 - 126 U/L 61 49 49  AST 15 - 41 U/L 13(L) 24 22  ALT 0 - 44 U/L 15 49(H) 43     RADIOGRAPHIC STUDIES: I have personally reviewed the radiological images as listed and agreed with the findings in the report. No results found.  ASSESSMENT & PLAN:   54 yo with   1) Acute Rt LE DVT 06/04/2019 VAS [place] Lower Extremity Venous Bilat (DVT) revealed "Right: Findings consistent with acute deep vein thrombosis involving the right femoral vein, right popliteal vein, and right peroneal veins. No cystic structure found in the popliteal fossa. Left: There is no evidence of deep vein thrombosis in the lower extremity. No cystic structure found in the popliteal fossa."  2) Acute extensive b/l Pulmonary emboli with rt heart strain s/p cath directed lytics ?provoking event - testosterone use 06/03/2019 CT Angio Chest PE ([id number]) revealed "1. Extensive bilateral pulmonary embolus with pulmonary emboli extending from the main pulmonary outflow  tracks throughout the upper and lower lobe pulmonary artery systems bilaterally. Positive for acute PE with CT evidence of right heart strain (RV/LV Ratio = 2.3) consistent with at least submassive (intermediate risk) PE. 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  -Discussed 09/06/2019 ECHO ([id number]) which revealed "1. Left ventricular ejection fraction, by estimation, is 60 to 65%. The left ventricle has normal function. The left ventricle has no regional wall motion abnormalities. Left ventricular diastolic parameters were normal. The average left ventricular global longitudinal strain is - 20.9 %. 2. Right ventricular systolic function is normal. The right ventricular size is normal. There is normal pulmonary artery systolic pressure. 3. The mitral valve is normal in structure. Trivial mitral valve regurgitation. No evidence of mitral stenosis. 4. The aortic valve is normal in structure. Aortic valve regurgitation is not visualized. No aortic stenosis is present." -Discussed with pt again that his heterozygous Prothrombin gene mutation will be an ongoing risk factor.  -Discussed again that Testosterone was an additional risk factor that has been eliminated.  -Advised pt that anticardiolipin antibodies would need to be persistently elevated (at least 3 months out) to be an ongoing risk factor for blood clots.  -Advised pt that it wouldn't be unreasonable to go to preventive dose anticoagulation or ASA if anticardiolipin antibodies are negative today. -Discussed full dose anticoagulation vs preventive dose anticoagulation vs baby ASA and their respective protection against blood clots. -Advised pt that if anticardiolipin antibodies or D-dimer is elevated today would recommend pt continue full-dose anticoagulation.    -Advised pt that we will balance blood clot prevention vs bleeding risk profile.  -Recommend pt avoid Testosterone replacement for life. -Continue 5 mg Eliquis BID  -Will check Cardiolipin antibodies & D-dimer today. -Will see back in 1 week via phone to discuss results of labs today   FOLLOW  UP: Additional Labs today  The total time spent in the appt was 20 minutes and more than 50% was on counseling and direct patient cares.  All of the patient's questions were answered with apparent satisfaction. The patient knows to call the clinic with any problems, questions or concerns.   Wyvonnia Lora MD MS AAHIVMS [facility] Hematology/Oncology Physician [facility]  (Office):       [phone] (Work cell):  [phone] (Fax):           [phone]  02/01/2020 9:50 AM  I, Carollee Herter, am acting as a scribe for Dr. Wyvonnia Lora.   .I have reviewed the above documentation for accuracy and completeness, and I agree with the above. Johney Maine MD

## 2020-02-01 ENCOUNTER — Inpatient Hospital Stay: Payer: Managed Care, Other (non HMO)

## 2020-02-01 ENCOUNTER — Inpatient Hospital Stay: Payer: Managed Care, Other (non HMO) | Attending: Hematology | Admitting: Hematology

## 2020-02-01 ENCOUNTER — Other Ambulatory Visit: Payer: Self-pay

## 2020-02-01 VITALS — BP 139/98 | HR 69 | Temp 97.1°F | Resp 18 | Ht 70.0 in | Wt 247.1 lb

## 2020-02-01 DIAGNOSIS — D6859 Other primary thrombophilia: Secondary | ICD-10-CM

## 2020-02-01 DIAGNOSIS — Z86711 Personal history of pulmonary embolism: Secondary | ICD-10-CM | POA: Diagnosis present

## 2020-02-01 DIAGNOSIS — Z86718 Personal history of other venous thrombosis and embolism: Secondary | ICD-10-CM | POA: Insufficient documentation

## 2020-02-01 DIAGNOSIS — Z800 Family history of malignant neoplasm of digestive organs: Secondary | ICD-10-CM | POA: Diagnosis not present

## 2020-02-01 DIAGNOSIS — I2699 Other pulmonary embolism without acute cor pulmonale: Secondary | ICD-10-CM

## 2020-02-01 DIAGNOSIS — Z833 Family history of diabetes mellitus: Secondary | ICD-10-CM | POA: Diagnosis not present

## 2020-02-01 DIAGNOSIS — Z79899 Other long term (current) drug therapy: Secondary | ICD-10-CM | POA: Diagnosis not present

## 2020-02-01 DIAGNOSIS — R760 Raised antibody titer: Secondary | ICD-10-CM

## 2020-02-01 DIAGNOSIS — Z7901 Long term (current) use of anticoagulants: Secondary | ICD-10-CM | POA: Diagnosis not present

## 2020-02-01 DIAGNOSIS — I10 Essential (primary) hypertension: Secondary | ICD-10-CM | POA: Insufficient documentation

## 2020-02-01 DIAGNOSIS — D6852 Prothrombin gene mutation: Secondary | ICD-10-CM

## 2020-02-01 DIAGNOSIS — Z8249 Family history of ischemic heart disease and other diseases of the circulatory system: Secondary | ICD-10-CM | POA: Diagnosis not present

## 2020-02-01 LAB — CBC WITH DIFFERENTIAL (CANCER CENTER ONLY)
Abs Immature Granulocytes: 0.03 10*3/uL (ref 0.00–0.07)
Basophils Absolute: 0.1 10*3/uL (ref 0.0–0.1)
Basophils Relative: 1 %
Eosinophils Absolute: 0.3 10*3/uL (ref 0.0–0.5)
Eosinophils Relative: 4 %
HCT: 47.4 % (ref 39.0–52.0)
Hemoglobin: 15.8 g/dL (ref 13.0–17.0)
Immature Granulocytes: 0 %
Lymphocytes Relative: 23 %
Lymphs Abs: 1.7 10*3/uL (ref 0.7–4.0)
MCH: 29.3 pg (ref 26.0–34.0)
MCHC: 33.3 g/dL (ref 30.0–36.0)
MCV: 87.8 fL (ref 80.0–100.0)
Monocytes Absolute: 0.9 10*3/uL (ref 0.1–1.0)
Monocytes Relative: 12 %
Neutro Abs: 4.3 10*3/uL (ref 1.7–7.7)
Neutrophils Relative %: 60 %
Platelet Count: 250 10*3/uL (ref 150–400)
RBC: 5.4 MIL/uL (ref 4.22–5.81)
RDW: 12.9 % (ref 11.5–15.5)
WBC Count: 7.2 10*3/uL (ref 4.0–10.5)
nRBC: 0 % (ref 0.0–0.2)

## 2020-02-01 LAB — CMP (CANCER CENTER ONLY)
ALT: 15 U/L (ref 0–44)
AST: 13 U/L — ABNORMAL LOW (ref 15–41)
Albumin: 3.9 g/dL (ref 3.5–5.0)
Alkaline Phosphatase: 61 U/L (ref 38–126)
Anion gap: 8 (ref 5–15)
BUN: 20 mg/dL (ref 6–20)
CO2: 24 mmol/L (ref 22–32)
Calcium: 9.8 mg/dL (ref 8.9–10.3)
Chloride: 106 mmol/L (ref 98–111)
Creatinine: 1.5 mg/dL — ABNORMAL HIGH (ref 0.61–1.24)
GFR, Est AFR Am: >60 mL/min (ref >60)
GFR, Estimated: 53 mL/min — ABNORMAL LOW (ref >60)
Glucose, Bld: 112 mg/dL — ABNORMAL HIGH (ref 70–99)
Potassium: 4 mmol/L (ref 3.5–5.1)
Sodium: 138 mmol/L (ref 135–145)
Total Bilirubin: 0.5 mg/dL (ref 0.3–1.2)
Total Protein: 7.6 g/dL (ref 6.5–8.1)

## 2020-02-01 LAB — D-DIMER, QUANTITATIVE: D-Dimer, Quant: 0.37 ug/mL-FEU (ref 0.00–0.50)

## 2020-02-03 LAB — CARDIOLIPIN ANTIBODIES, IGG, IGM, IGA
Anticardiolipin IgA: <9 APL U/mL (ref 0–11)
Anticardiolipin IgG: 20 GPL U/mL — ABNORMAL HIGH (ref 0–14)
Anticardiolipin IgM: <9 MPL U/mL (ref 0–12)

## 2020-02-08 ENCOUNTER — Telehealth: Payer: Self-pay | Admitting: *Deleted

## 2020-02-08 NOTE — Telephone Encounter (Signed)
Per Dr.Kale, called to make pt aware that his anticardiolipin IgG levels are still elevated --would recommend continued long term anticoagulation. Pt verbalized understanding.

## 2020-07-15 ENCOUNTER — Other Ambulatory Visit: Payer: Self-pay | Admitting: Hematology

## 2020-10-10 ENCOUNTER — Telehealth: Payer: Self-pay | Admitting: *Deleted

## 2020-10-10 NOTE — Telephone Encounter (Signed)
Prior Authorization Report   Supportive Medication: Eliquis 5 mg tablets   "CoverMyMeds" KEY: [id number]    Plan: [employer] Phone: [phone] Case ID: [id number]   Status: Approved  Start Date: 09/09/2020  End Date: 10/09/2021

## 2020-10-24 NOTE — Telephone Encounter (Signed)
Connected with KISHAN WACHSMUTH ([phone]) regarding CoverMyMeds Prior Auth. Key: [id number] for ELIQUIS 5 MG tablets.  Asked if Co-pay coupon needed for [facility] previously authorized.  "No. My insurance pays for all but $10.00."   No further activity performed by this nurse.

## 2021-04-14 ENCOUNTER — Other Ambulatory Visit: Payer: Self-pay | Admitting: Hematology

## 2021-07-28 IMAGING — XA IR INFUSION THROMBOL ARTERIAL INITIAL (MS)
7 of 8 series · 12 of 22 positions shown · IV contrast (IODINE)
Comparison: Same day CT imaging

INDICATION: 53 year old male with history of submassive high risk PE, presents
for treatment.

EXAM:
US GUIDED ACCESS RIGHT COMMON FEMORAL VEIN X 2
INITIATION OF BILATERAL CATHETER DIRECTED THROMBOLYSIS PULMONARY
ARTERIES
TECHNIQUE: Informed consent was obtained from the patient and the patient's
family following explanation of the procedure, risks, benefits and
alternatives. Specific risks include bleeding, infection, contrast
reaction, kidney injury, venous injury, life-threatening hemorrhage
including brain hemorrhage, gastrointestinal hemorrhage, epistaxis,
need for further surgery, need for further procedure,
cardiopulmonary collapse, death. The patient understands, agrees and
consents for the procedure. All questions were addressed.

[Series 2: care body 4 · 2 of 16 frames shown (1 of 5)]
[frame 3/16  full-range]
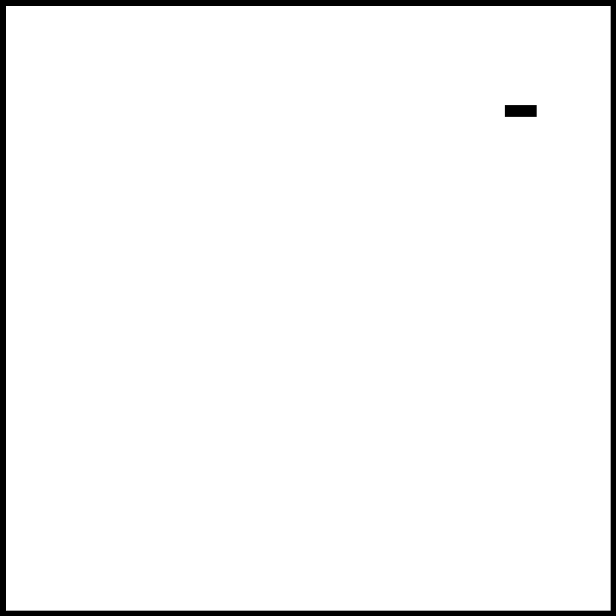
[frame 10/16]
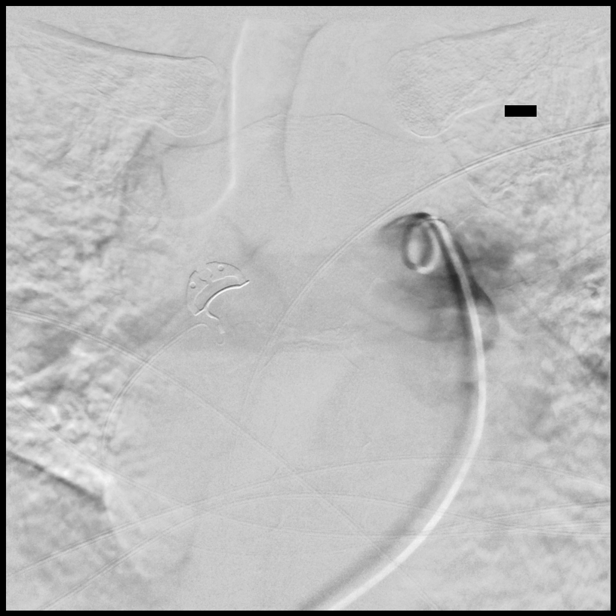

[Series 2: care body 4 · 2 of 16 frames shown (2 of 5)]
[frame 3/16  full-range]
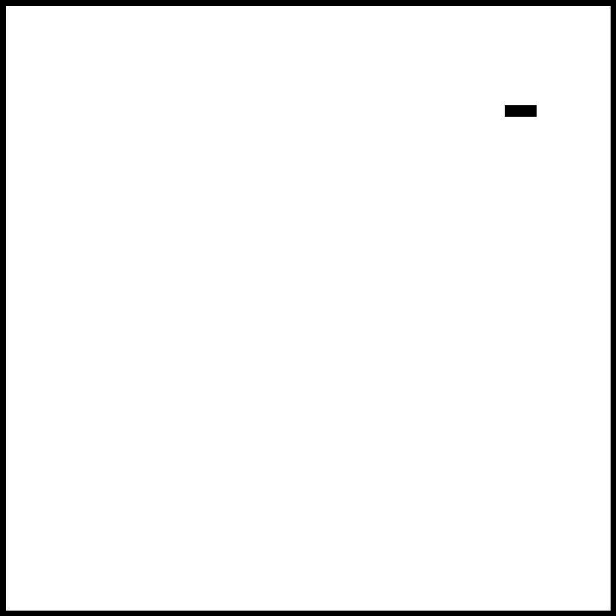
[frame 10/16]
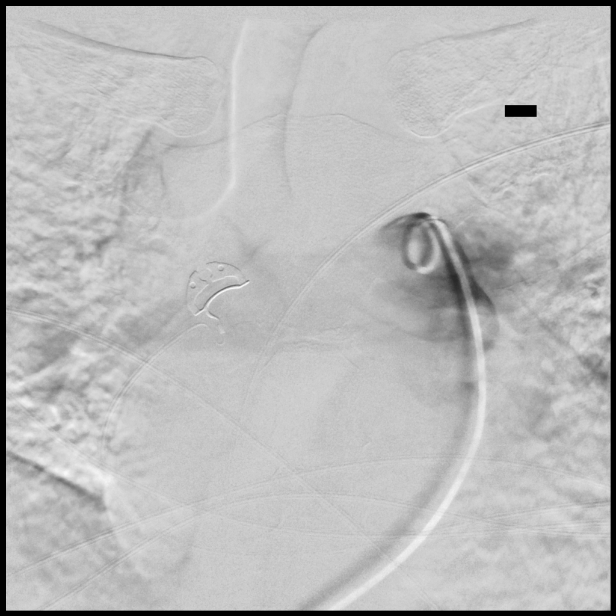

[Series 3: care body 4 · 3 of 13 frames shown (3 of 5)]
[frame 2/13]
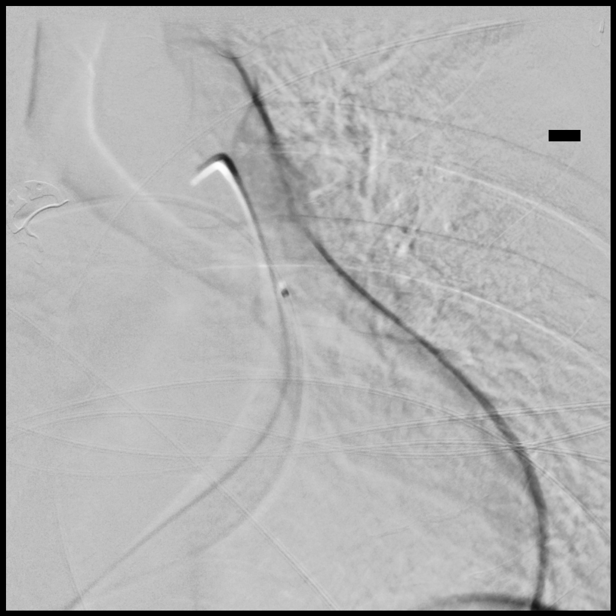
[frame 7/13]
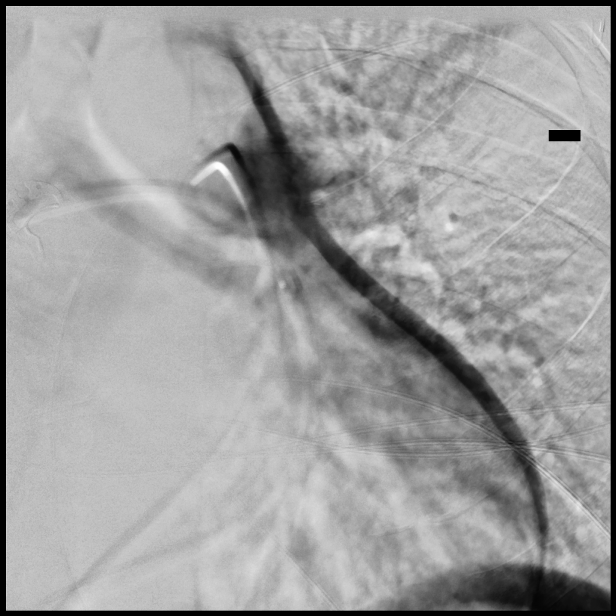
[frame 12/13]
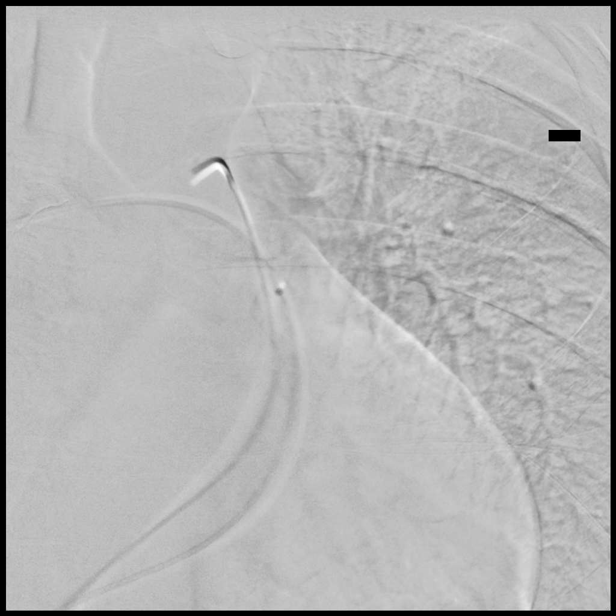

[Series 3: care body 4 · 2 of 13 frames shown (4 of 5)]
[frame 6/13]
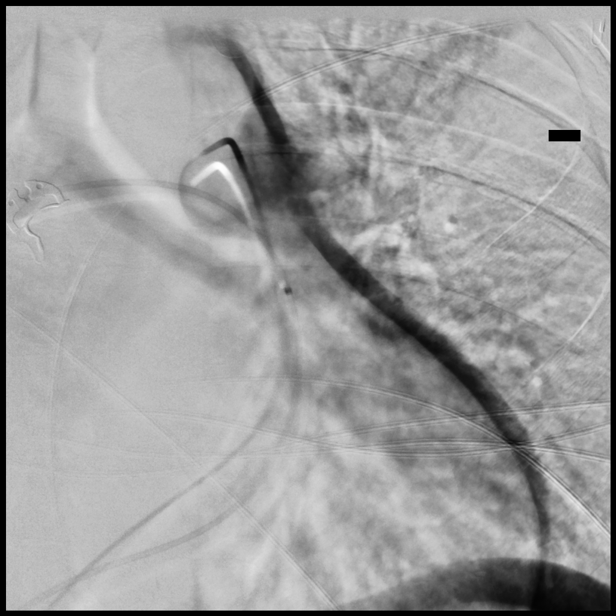
[frame 12/13]
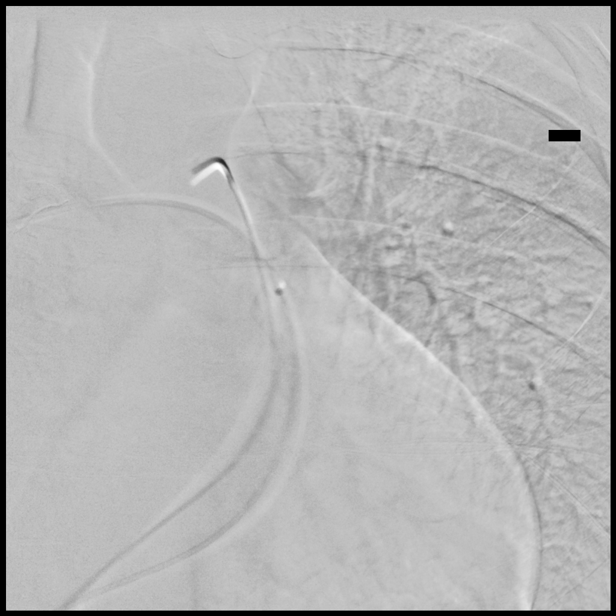

[Series 4: care body 4 · 1 of 1 slices shown (5 of 5)]
[im 1/1]
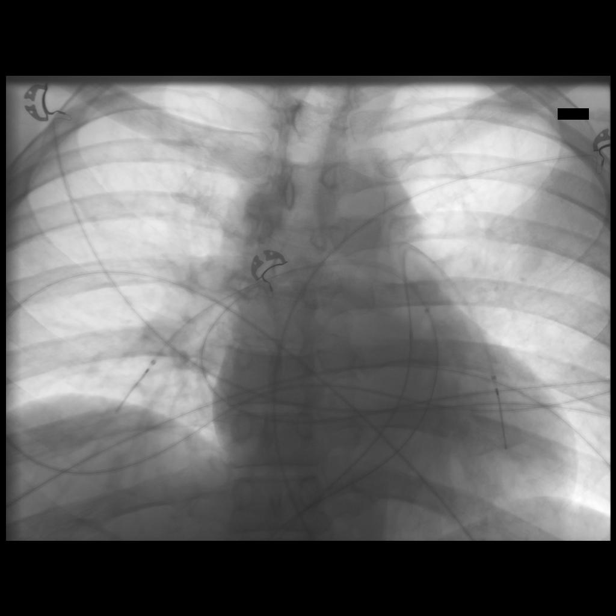

[Series 300: ir angiogram pulmonary bilateral selecti · 1 of 2 slices shown (1 of 2)]
[im 2/2]
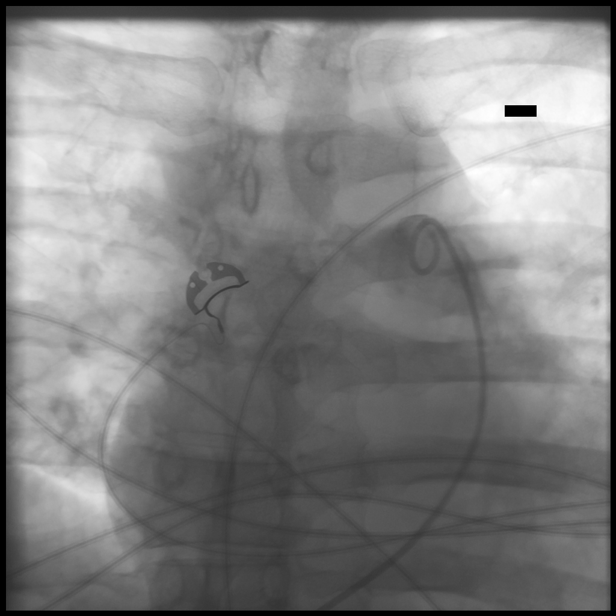

[Series 300: ir angiogram pulmonary bilateral selecti · 1 of 2 slices shown (2 of 2)]
[im 2/2]
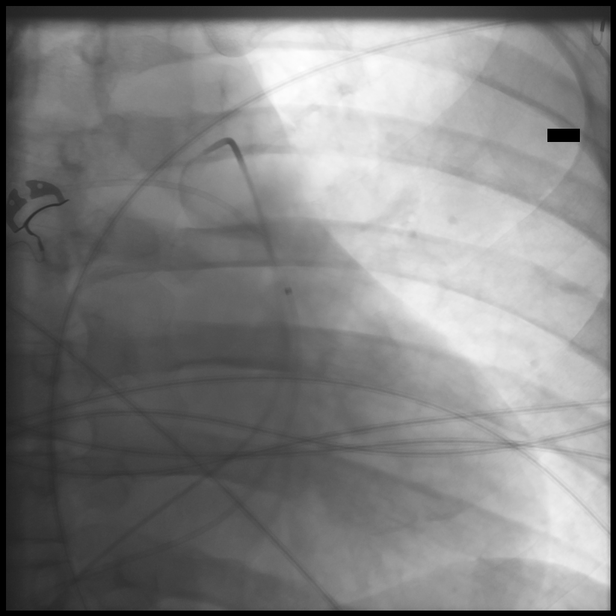

[12 of 22 positions shown; findings below may reference images not displayed]

MEDICATIONS:
None.

ANESTHESIA/SEDATION:
Versed 1.5  Mg IV; Fentanyl 75 mcg IV

Moderate Sedation Time: 30 min

The patient was continuously monitored during the procedure by the
interventional radiology nurse under my direct supervision.

FLUOROSCOPY TIME:  Fluoroscopy Time: 6 minutes 24 seconds (222 mGy).

COMPLICATIONS:
None
Patient is position supine position on the fluoroscopy table.
Maximal barrier sterile technique utilized including caps, mask,
sterile gowns, sterile gloves, large sterile drape, hand hygiene,
and betadine prep. 1% lidocaine used for local anesthesia. Moderate
sedation was provided.

Ultrasound survey of the right inguinal region was performed with
images stored and sent to PACs.

A single wall needle was used access the right common femoral vein
under ultrasound. With venous blood flow returned, an 035 wire was
passed through the needle into the IVC. A 7 French sheath was placed
over the wire. The dilator was removed and the sheath was flushed.
Subsequently, a single wall needle was used access the right common
femoral vein adjacent to the initial puncture. With venous blood
flow returned, a 6 French vascular sheath was placed over the wire.
The dilator was removed and the sheath was flushed.

Combination of an angled pigtail catheter and benson wire was then
used to select the main pulmonary artery. Wire was removed and a
main pulmonary artery pressure transduction was recorded.

Glidewire was then advanced into the right pulmonary artery lower
lobe branches. The pigtail catheter was removed, and we selected a
90cm length, 15cm infusion length unifuse catheter, placed into the
lower lobe branches.

Ytsma catheter and glide wire was then used to navigate into the
left-sided pulmonary veins, with selection of lower lobe pulmonary
vein. Glidewire was used to advance into a more distal position and
the catheter was placed into lower lobe segments. Catheter was
removed and a 15 cm infusion length unifuse catheter was placed.
Small contrast infusion confirmed position. Catheter was flushed.

The obturator wires were then placed through both the left and right
catheters.

Sheaths were secured in position.  Final image was stored.

The patient tolerated the procedure well and remained
hemodynamically stable throughout.

No complications were encountered and no significant blood loss was
encountered.
FINDINGS: Ultrasound survey demonstrates patent right common femoral vein.

Main pulmonary artery pressure measures 76/20 (41).

Final image demonstrates right-sided catheter directed into lower
lobar branches and left sided catheter directed into lower lobar
branches.

The 7 French ([REDACTED]) sheaths transmits the right-sided catheter.
The 6 French (green) sheath transmits the left-sided pulmonary
artery catheter.
IMPRESSION: Status post placement of left and right pulmonary artery infusion
catheters for initiation of catheter directed thrombolysis.

PLAN:
Patient will be ICU status overnight.

Bed rest.

Both the right and left catheter will receive 1 mg tPA per hour for
12 hours, for a total dose of 24 mg over 12 hours.

Plan for repeat trans duction of catheter pressures after treatment,
reassessment, and probable removal of catheters.

EVery 6 our blood draw, including CBC, heparin level, and
fibrinogen.

## 2021-07-28 IMAGING — XA IR INFUSION THROMBOL ARTERIAL INITIAL (MS)
4 series · 11 of 11 positions shown · IV contrast (IODINE)
Comparison: Same day CT imaging

INDICATION: 53 year old male with history of submassive high risk PE, presents
for treatment.

EXAM:
US GUIDED ACCESS RIGHT COMMON FEMORAL VEIN X 2
INITIATION OF BILATERAL CATHETER DIRECTED THROMBOLYSIS PULMONARY
ARTERIES
TECHNIQUE: Informed consent was obtained from the patient and the patient's
family following explanation of the procedure, risks, benefits and
alternatives. Specific risks include bleeding, infection, contrast
reaction, kidney injury, venous injury, life-threatening hemorrhage
including brain hemorrhage, gastrointestinal hemorrhage, epistaxis,
need for further surgery, need for further procedure,
cardiopulmonary collapse, death. The patient understands, agrees and
consents for the procedure. All questions were addressed.

[Series 2: care body 4 · 4 of 16 frames shown (1 of 3)]
[frame 3/16  full-range]
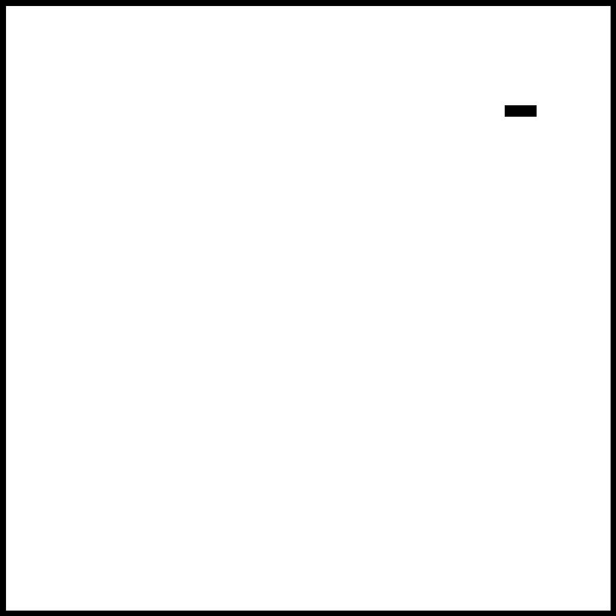
[frame 9/16]
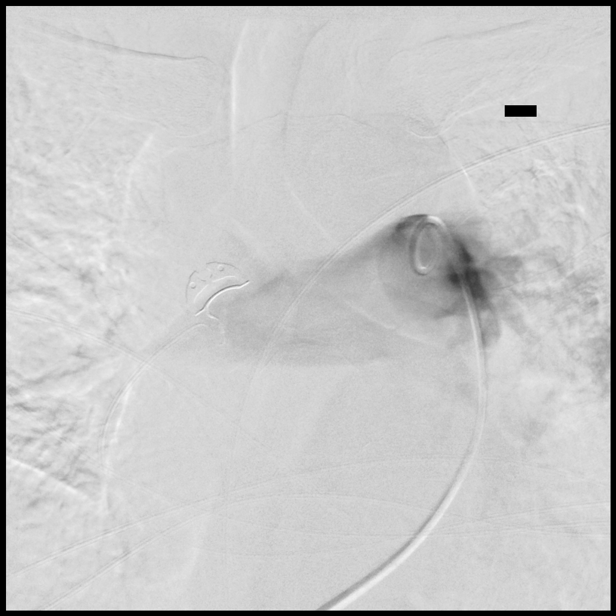
[frame 10/16]
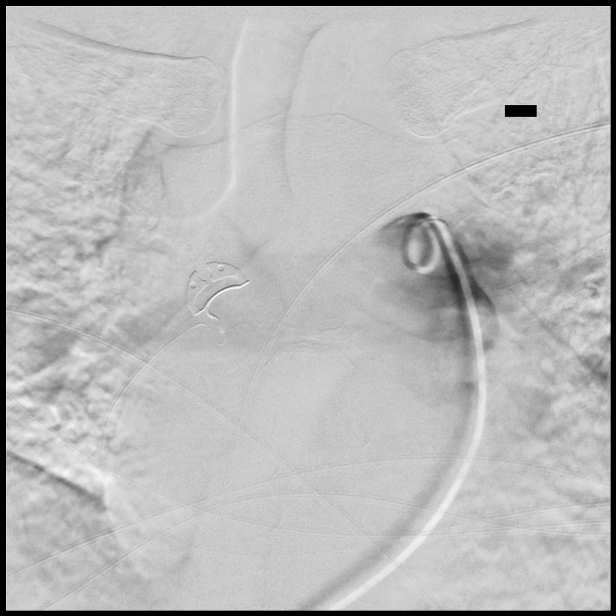
[frame 14/16]
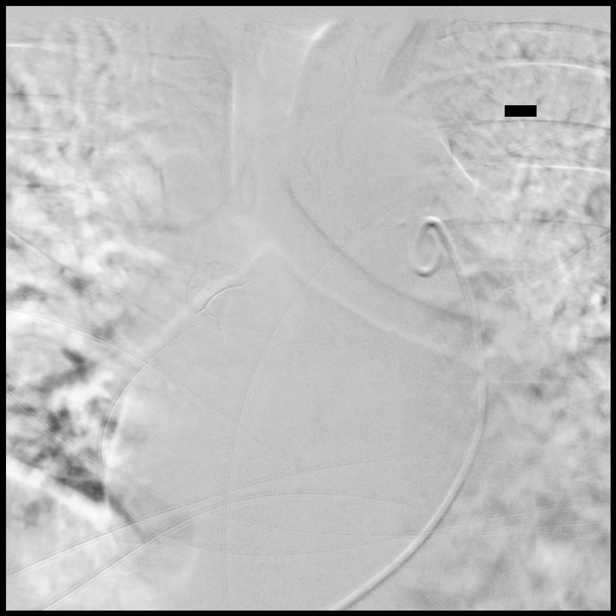

[Series 3: care body 4 · 4 of 13 frames shown (2 of 3)]
[frame 2/13]
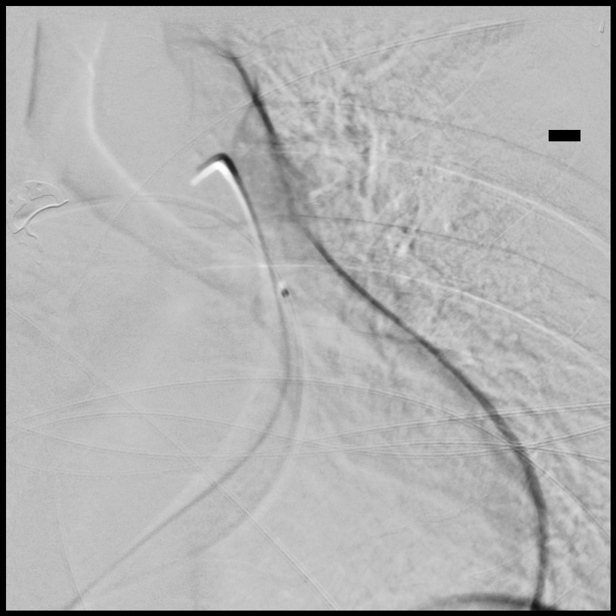
[frame 6/13]
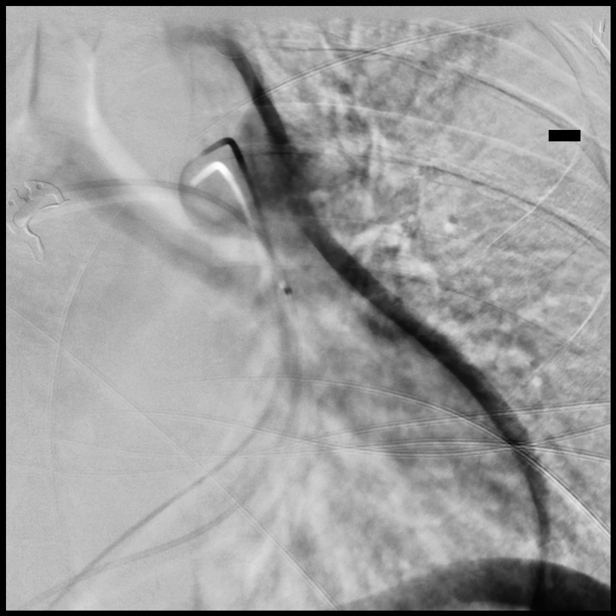
[frame 7/13]
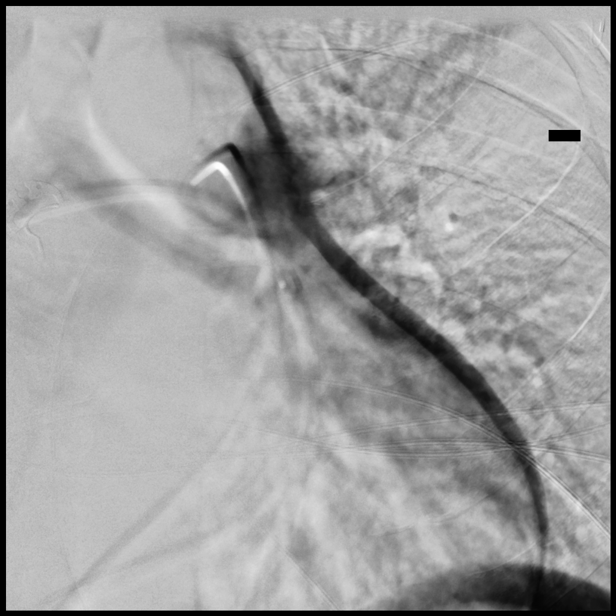
[frame 12/13]
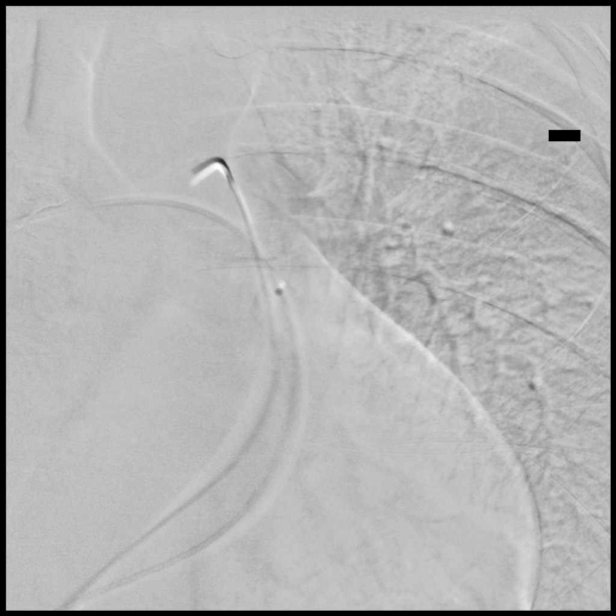

[Series 4: care body 4 · 1 of 1 slices shown (3 of 3)]
[im 1/1]
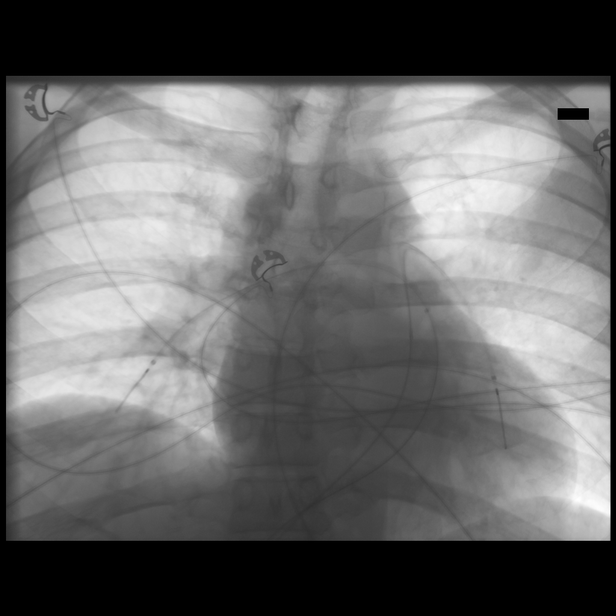

[Series 300: ir angiogram pulmonary bilateral selecti · 2 of 2 slices shown]
[im 1/2]
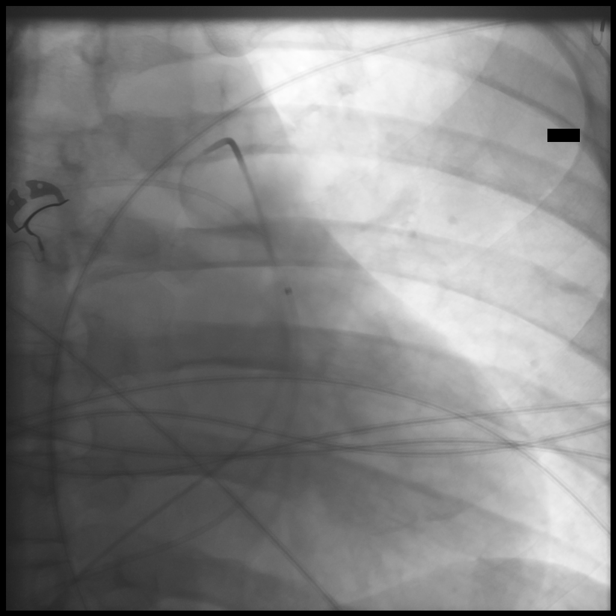
[im 2/2]
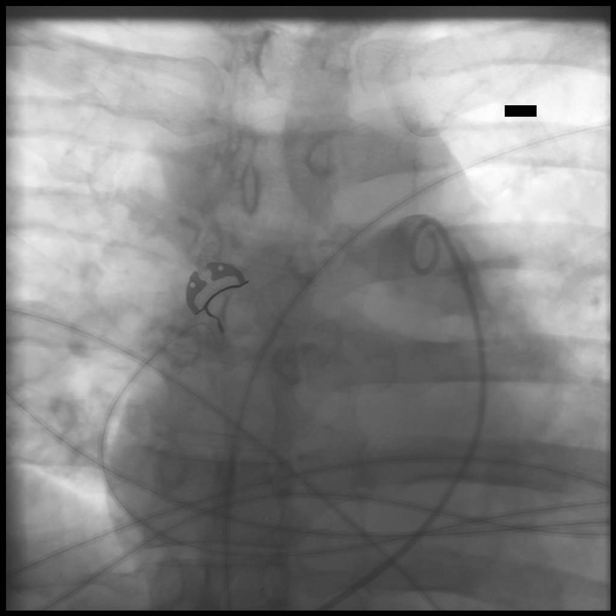

[11 of 11 positions shown; findings below may reference images not displayed]

MEDICATIONS:
None.

ANESTHESIA/SEDATION:
Versed 1.5  Mg IV; Fentanyl 75 mcg IV

Moderate Sedation Time: 30 min

The patient was continuously monitored during the procedure by the
interventional radiology nurse under my direct supervision.

FLUOROSCOPY TIME:  Fluoroscopy Time: 6 minutes 24 seconds (222 mGy).

COMPLICATIONS:
None
Patient is position supine position on the fluoroscopy table.
Maximal barrier sterile technique utilized including caps, mask,
sterile gowns, sterile gloves, large sterile drape, hand hygiene,
and betadine prep. 1% lidocaine used for local anesthesia. Moderate
sedation was provided.

Ultrasound survey of the right inguinal region was performed with
images stored and sent to PACs.

A single wall needle was used access the right common femoral vein
under ultrasound. With venous blood flow returned, an 035 wire was
passed through the needle into the IVC. A 7 French sheath was placed
over the wire. The dilator was removed and the sheath was flushed.
Subsequently, a single wall needle was used access the right common
femoral vein adjacent to the initial puncture. With venous blood
flow returned, a 6 French vascular sheath was placed over the wire.
The dilator was removed and the sheath was flushed.

Combination of an angled pigtail catheter and benson wire was then
used to select the main pulmonary artery. Wire was removed and a
main pulmonary artery pressure transduction was recorded.

Glidewire was then advanced into the right pulmonary artery lower
lobe branches. The pigtail catheter was removed, and we selected a
90cm length, 15cm infusion length unifuse catheter, placed into the
lower lobe branches.

Ytsma catheter and glide wire was then used to navigate into the
left-sided pulmonary veins, with selection of lower lobe pulmonary
vein. Glidewire was used to advance into a more distal position and
the catheter was placed into lower lobe segments. Catheter was
removed and a 15 cm infusion length unifuse catheter was placed.
Small contrast infusion confirmed position. Catheter was flushed.

The obturator wires were then placed through both the left and right
catheters.

Sheaths were secured in position.  Final image was stored.

The patient tolerated the procedure well and remained
hemodynamically stable throughout.

No complications were encountered and no significant blood loss was
encountered.
FINDINGS: Ultrasound survey demonstrates patent right common femoral vein.

Main pulmonary artery pressure measures 76/20 (41).

Final image demonstrates right-sided catheter directed into lower
lobar branches and left sided catheter directed into lower lobar
branches.

The 7 French ([REDACTED]) sheaths transmits the right-sided catheter.
The 6 French (green) sheath transmits the left-sided pulmonary
artery catheter.
IMPRESSION: Status post placement of left and right pulmonary artery infusion
catheters for initiation of catheter directed thrombolysis.

PLAN:
Patient will be ICU status overnight.

Bed rest.

Both the right and left catheter will receive 1 mg tPA per hour for
12 hours, for a total dose of 24 mg over 12 hours.

Plan for repeat trans duction of catheter pressures after treatment,
reassessment, and probable removal of catheters.

EVery 6 our blood draw, including CBC, heparin level, and
fibrinogen.

## 2022-12-09 ENCOUNTER — Other Ambulatory Visit: Payer: Self-pay | Admitting: Urology

## 2022-12-09 DIAGNOSIS — R972 Elevated prostate specific antigen [PSA]: Secondary | ICD-10-CM

## 2023-01-22 ENCOUNTER — Other Ambulatory Visit: Payer: Managed Care, Other (non HMO)

## 2023-02-05 ENCOUNTER — Ambulatory Visit
Admission: RE | Admit: 2023-02-05 | Discharge: 2023-02-05 | Disposition: A | Discharge status: Home / Self Care | Payer: Commercial Managed Care - PPO | Source: Ambulatory Visit | Attending: Urology

## 2023-02-05 DIAGNOSIS — R972 Elevated prostate specific antigen [PSA]: Secondary | ICD-10-CM

## 2023-02-05 MED ORDER — GADOPICLENOL 0.5 MMOL/ML IV SOLN
10.0000 mL | Freq: Once | INTRAVENOUS | Status: AC | PRN
  Administered 2023-02-05: 10 mL via INTRAVENOUS

## 2023-03-18 NOTE — Progress Notes (Signed)
 Prostate Cancer Rapid Access APP Initial Visit Note   Patient Name: Ricky Bentley Patient Age: 57 y.o. Encounter Date: 03/20/2023 MRN: [id number]  Referring Physician:  Cam Morene ORN, MD [address] [place],  [place] [address]  Rapid Access APP name:  Rosaline Cordial, [place]   Patient address:  [address] [place] [address]  Assessment: Ricky Bentley is a healthy  57 y.o. 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 MRI negative for ECE and SV involvement. No prostate margin abutment. No additional imaging to-date.    Discussion:  We discussed the following during today's visit:  You have been recently diagnosed with unfavorable intermediate risk risk prostate cancer (GG2, more than 50 % positive cores) In order to determine the nature of a prostate cancer, we give it a score called Gleason score. That is how normal or not the cancer cells looks when compared to normal prostate. The score ranges from 6-10, with 6 looking most like normal prostate, 10 looking least like normal prostate, and everything else in between. This score is calculated based on the two most abundant patterns in each core that are added together to get the total Gleason. Your biopsy showed GG2 Gleason 3+4= 7 in 10/15  cores. Your pathology will also be reviewed by a [facility] pathologist. This may result in changes to your Gleason score or risk stratification.  It is an adenocarcinoma, which is the most common type of prostate cancer You will require imaging tests called NM bone scan.  The next step is going to be to get you scheduled to meet with our prostate cancer experts. I would like for you to meet with a urologist  (surgeon), radiation oncologist, and medical oncologist. There are several treatment options for prostate cancer and we will review these, including risk/benefit/side effects, in more detail during those appointments. Some treatment for you may include:  Surgery (radical prostatectomy which is removal of the whole prostate gland) Radiation therapy  Androgen deprivation therapy (ADT)   The Prostate [employer] Foundation has a Patient Guide that you may find helpful. You may access it via their website at [email or web address].com/ The Prostate [employer] Education [employer] has educational materials as well: [employer]ricationGuide[employer]ca  I know there is a lot of uncertainty that comes with receiving the news of a cancer diagnosis.  Please continue to take good care of yourself (e.g. balanced diet, exercise, restorative sleep, etc.). Our team strives to provide excellent care during this experience. Please reach out with any questions or concerns.   Plan:  -- Obtain NM bone scan to complete staging based on unfavorable intermediate risk stratification --  [facility] appointment (urology and radiation oncology) after completion of NM bone scan, if able -- Patient lives in [place], [place] (44 miles from [facility]) -- Up-to-date on colonoscopy -- On blood thinner for hx of PE (Eliquis ), previously held without issue  -------------------------  History of Present Illness: Ricky Bentley (prefers [name]) is a healthy fit  57 y.o. male who is seen in today at the request of Dr. Morene MICAEL Cam by our Prostate Cancer Rapid Access APP to begin discussions about his newly diagnosed prostate cancer and next steps.  Joined today by spouse, Mrs. Dreama Mote.   PSA  History: 02/2023: ~4.2 (per patient, unable to access lab result) 12/02/2022: 4.96 08/26/2022: 5.95 09/2021: 5.95  12/09/2022: Referred by PCP (Dr. Tanda Bame) to [facility]  (Dr. Cam) for elevated  PSA. 12/02/2022: PSA 4.96. No urine modifying medications. No family history of prostate cancer.   02/05/2023: [facility] re-read of OSH MRI of prostate showed:   PROSTATE SIZE: Prostate measures 4.5 x 3.8 x 4.1 cm, 32 mL   PERIPHERAL ZONE: Scattered areas of heterogeneous T2 signal hypointensity which may reflect sequela of prostatitis or scarring. Areas described on outside MRI appear more wedge-shaped without discrete restricted diffusion and are favored represent inflammation/prostatitis, PI-RADS 2. No suspicious lesions.   TRANSITIONAL ZONE: Central gland enlargement with changes of benign prostatic hypertrophy. Suspicious lesions as below:   Lesion 1: Size: 0.8 x 0.4 x 0.6 cm Location: Right mid anterior transitional T2: Homogenously T2 hypointense PI-RADS: 3 DWI: Mild restricted diffusion PI-RADS: 3 DCE: Inadequately assessed Prostate margin: Does not abut the prostate margin Extracapsular Extension: Absent Overall Lesion PI-RADS category: 3   SEMINAL VESICLES: Unremarkable. Seminal vesicles are symmetric in appearance.   NEUROVASCULAR BUNDLES: Unremarkable.   BLADDER: Urinary bladder appears normal.   LYMPH NODES: No pathologic pelvic or inguinal adenopathy.   OTHER: No pelvic free fluid or drainable collection.   VESSELS: Vascular structures of the pelvis appear patent.   LARGE FIELD-OF-VIEW: No findings of bowel obstruction on large field-of-view imaging. No hydronephrosis of the included kidneys.   BONES AND SOFT TISSUES: No suspicious enhancing marrow signal lesions.     IMPRESSION: PI-RADS 3 lesion in the right anterior mid gland transition zone. No evidence of metastatic disease in the pelvis.   Maximum PI-RADS Category 3    PI-RADSregistered v2.1 Assessment Categories PI-RADS 1 - Very low (clinically significant cancer is highly unlikely to be present) PI-RADS 2 - Low (clinically significant cancer is unlikely to be present) PI-RADS 3 - Intermediate (the presence  of clinically significant cancer is equivocal) PI-RADS 4 - High (clinically significant cancer is likely to be present) PI-RADS 5 - Very high (clinically significant cancer is highly likely to be present   02/20/2023: MRI-fusion biopsy showed 3+4=7 (8/12 systematic cores and 2/3 targeted cores, 10/15 total positive cores).        Baseline Symptoms: General: No acute issues. Healthy and very active. Works full-time (Herbalist), plays pickleball daily (in a league) Urinary: No symptoms at baseline. Nocturia 0-1 (voids upon rising at 5:00am), no issues with daytime frequency, - dysuria, - hematuria, occ post-void dribbling. No urine modifying medications GI: Denies loose stools, rectal pain, or bleeding. Has regular formed bowel movement every morning Sexual health: Previously diagnosed with hypogonadism in 2019, briefly on exogenous testosterone  (no longer taking), no issues achieving or maintaining erections. No PDE-5 inhibitors Colonoscopy: 2023 or early 2024, per patient. + polyps. Next due in 2028 Inflammatory bowel disease: no  Prior Radiation Therapy:no Pacemaker: no Pregnancy status: No; male patient  MSK Nomogram: Assumes T1c 15 year prostate cancer-specific survival: 98% Progression-free probability:   5 year: 79%  10 year: 67% Organ confined disease: 56% Extracapsular extension: 43% LN involvement: 6% SV involvement: 6%   Genitourinary Symptom Sheet - Physician Form  (CTCAE v.5.0)  Version Date 09/27/16  1) Diarrhea - An increase in frequency and/or loose or watery bowel movements Grade 0 - Baseline  2) Proctitis - Inflammation of the rectum Grade 0 - Baseline  1) Fatigue - A state of generalized weakness with a pronounced inability to summon sufficient  energy to accomplish daily activities. Grade 0 - Baseline  2) Hot flashes - Uncomfortable and temporary sensation of intense body warmth, flushing, sometimes accompanied by sweating upon cooling. Grade 0  - Baseline  3) Dermatitis radiation - A finding of cutaneous inflammatory reaction occurring as a result of exposure to biologically effective levels of ionizing radiation. Grade 0 - Baseline  1) Urinary frequency - Urination at short intervals Grade 0 - Baseline  2) Urinary incontinence - Inability to control the flow of urine from the bladder Grade 0 - Baseline  3) Urinary retention - Accumulation of urine within the bladder because of the inability to urinate. Grade 0 - Baseline  4) Urinary tract pain - A sensation of marked discomfort in the urinary tract Grade 0 - Baseline  5) Urinary urgency - A disorder characterized by a sudden compelling urge to urinate. Grade 0 - Baseline  6) Bladder spasm - A disorder characterized by a sudden and involuntary contraction of the bladder wall. Grade 0 - Baseline  1) Nocturia 0 times per night  2) Hot Flashes 0 times per day  3) Medications   Alpha Antagonists None  Analgesics None  5a Reductase Inhibitors None  Bowel Agents None  Antispasmodics None  Other (GI/GU) none   Review of Systems: A 10 systems was negative except for pertinent positives noted in HPI.  Past Medical History:  Pulmonary embolism (2020), felt to be 2/2 to use of exogenous tesoterone HTN Gout  Achilles tendon repair  Family History:  Oncological history:  Maternal grandmother: no known oncological history Maternal grandfather: no known oncological history Paternal grandmother: liver cancer Paternal grandfather:no known oncological history Mother:no known oncological history Father: no known oncological history Siblings:no known oncological history, 1 brother and 3 step-siblings (1 sister, 2 brothers) Children: 2 sons, no oncological history   Social History: Married, non-smoker, occ drink (social), works full-time in Network engineer, 2 sons, avid Firefighter (in a league, plays every day)    Allergies: No  known allergies  Current Medications: Eliquis   Losartan  PRN medication for gout (no recent use)   Physical Exam:  Karnofsky/Lansky Performance Status:  100 - fully active, normal (ECOG equivalent 0) General: Well-developed, no acute distress HEENT:Normocephalic, moist mucous membranes CV: Warm, well perfused Respiratory: Breathing comfortably on room air GI: Non-distended MSK: Good muscle tone Neuro: Alert and oriented to conversation Psych: Appropriate insight and affect GU: deferred to avoid multiple exams.   Labs: Reviewed in [facility]  Radiology: Radiology reviewed and as outlined above in the HPI   Pathology: Pathology report reviewed and as outlined above in the HPI  Old/outside medical records: Personally reviewed and as noted in the HPI. _____________________ Rosaline Cordial, NP [facility] Radiation Oncology

## 2023-04-17 ENCOUNTER — Other Ambulatory Visit (HOSPITAL_COMMUNITY): Payer: Self-pay | Admitting: Gerontology

## 2023-04-17 DIAGNOSIS — C61 Malignant neoplasm of prostate: Secondary | ICD-10-CM

## 2023-04-23 ENCOUNTER — Encounter (HOSPITAL_COMMUNITY)
Admission: RE | Admit: 2023-04-23 | Discharge: 2023-04-23 | Disposition: A | Discharge status: Home / Self Care | Payer: Commercial Managed Care - PPO | Source: Ambulatory Visit | Attending: Gerontology | Admitting: Gerontology

## 2023-04-23 DIAGNOSIS — C61 Malignant neoplasm of prostate: Secondary | ICD-10-CM | POA: Insufficient documentation

## 2023-04-23 MED ORDER — TECHNETIUM TC 99M MEDRONATE IV KIT
20.0000 | PACK | Freq: Once | INTRAVENOUS | Status: AC | PRN
  Administered 2023-04-23: 20.4 via INTRAVENOUS

## 2023-06-20 ENCOUNTER — Other Ambulatory Visit: Payer: Self-pay | Admitting: Urology

## 2023-07-14 ENCOUNTER — Encounter (HOSPITAL_COMMUNITY): Payer: Self-pay

## 2023-07-14 NOTE — Patient Instructions (Addendum)
SURGICAL WAITING ROOM VISITATION  Patients having surgery or a procedure may have no more than 2 support people in the waiting area - these visitors may rotate.    Children under the age of 4 must have an adult with them who is not the patient.  Due to an increase in RSV and influenza rates and associated hospitalizations, children ages 49 and under may not visit patients in [facility] hospitals.  Visitors with respiratory illnesses are discouraged from visiting and should remain at home.  If the patient needs to stay at the hospital during part of their recovery, the visitor guidelines for inpatient rooms apply. Pre-op nurse will coordinate an appropriate time for 1 support person to accompany patient in pre-op.  This support person may not rotate.    Please refer to the [facility] website for the visitor guidelines for Inpatients (after your surgery is over and you are in a regular room).       Your procedure is scheduled on: 07-28-23   Report to [facility] Main Entrance    Report to admitting at      0515  AM   Call this number if you have problems the morning of surgery [phone]   Do not eat food OR DRINK LIQUIDS  :After Midnight.            If you have questions, please contact your surgeon's office.   FOLLOW BOWEL PREP AND ANY ADDITIONAL PRE OP INSTRUCTIONS YOU RECEIVED FROM YOUR SURGEON'S OFFICE!!!             ONE  8 OZ BOTTLE OF MAGNESIUM CITRATE BY NOON THE DAY BEFORE SURGERY            ONE FLEETS ENEMA THE NIGHT BEFORE SURGERY   Oral Hygiene is also important to reduce your risk of infection.                                    Remember - BRUSH YOUR TEETH THE MORNING OF SURGERY WITH YOUR REGULAR TOOTHPASTE  DENTURES WILL BE REMOVED PRIOR TO SURGERY PLEASE DO NOT APPLY "Poly grip" OR ADHESIVES!!!   Do NOT smoke after Midnight   Stop all vitamins and herbal supplements 7 days before surgery.   Take these medicines the morning of surgery with A SIP  OF WATER: NONE                                  You may not have any metal on your body including hair pins, jewelry, and body piercing             Do not wear  lotions, powders, /cologne, or deodorant               Men may shave face and neck.   Do not bring valuables to the hospital. [facility] IS NOT             RESPONSIBLE   FOR VALUABLES.   Contacts, glasses, dentures or bridgework may not be worn into surgery.   Bring small overnight bag day of surgery.   DO NOT BRING YOUR HOME MEDICATIONS TO THE HOSPITAL. PHARMACY WILL DISPENSE MEDICATIONS LISTED ON YOUR MEDICATION LIST TO YOU DURING YOUR ADMISSION IN THE HOSPITAL!     Special Instructions: Bring a copy of your healthcare power of attorney  and living will documents the day of surgery if you haven't scanned them before.              Please read over the following fact sheets you were given: IF YOU HAVE QUESTIONS ABOUT YOUR PRE-OP INSTRUCTIONS PLEASE CALL [phone]   . If you test positive for Covid or have been in contact with anyone that has tested positive in the last 10 days please notify you surgeon.    [facility] - Preparing for Surgery Before surgery, you can play an important role.  Because skin is not sterile, your skin needs to be as free of germs as possible.  You can reduce the number of germs on your skin by washing with CHG (chlorahexidine gluconate) soap before surgery.  CHG is an antiseptic cleaner which kills germs and bonds with the skin to continue killing germs even after washing. Please DO NOT use if you have an allergy to CHG or antibacterial soaps.  If your skin becomes reddened/irritated stop using the CHG and inform your nurse when you arrive at Short Stay. Do not shave (including legs and underarms) for at least 48 hours prior to the first CHG shower.  You may shave your face/neck. Please follow these instructions carefully:  1.  Shower with CHG Soap the night before surgery and the  morning of  Surgery.  2.  If you choose to wash your hair, wash your hair first as usual with your  normal  shampoo.  3.  After you shampoo, rinse your hair and body thoroughly to remove the  shampoo.                           4.  Use CHG as you would any other liquid soap.  You can apply chg directly  to the skin and wash                       Gently with a scrungie or clean washcloth.  5.  Apply the CHG Soap to your body ONLY FROM THE NECK DOWN.   Do not use on face/ open                           Wound or open sores. Avoid contact with eyes, ears mouth and genitals (private parts).                       Wash face,  Genitals (private parts) with your normal soap.             6.  Wash thoroughly, paying special attention to the area where your surgery  will be performed.  7.  Thoroughly rinse your body with warm water from the neck down.  8.  DO NOT shower/wash with your normal soap after using and rinsing off  the CHG Soap.                9.  Pat yourself dry with a clean towel.            10.  Wear clean pajamas.            11.  Place clean sheets on your bed the night of your first shower and do not  sleep with pets. Day of Surgery : Do not apply any lotions/deodorants the morning of surgery.  Please wear clean clothes to the hospital/surgery  center.  FAILURE TO FOLLOW THESE INSTRUCTIONS MAY RESULT IN THE CANCELLATION OF YOUR SURGERY PATIENT SIGNATURE_________________________________  NURSE SIGNATURE__________________________________  ________________________________________________________________________ WHAT IS A BLOOD TRANSFUSION? Blood Transfusion Information  A transfusion is the replacement of blood or some of its parts. Blood is made up of multiple cells which provide different functions. Red blood cells carry oxygen and are used for blood loss replacement. White blood cells fight against infection. Platelets control bleeding. Plasma helps clot blood. Other blood products are available  for specialized needs, such as hemophilia or other clotting disorders. BEFORE THE TRANSFUSION  Who gives blood for transfusions?  Healthy volunteers who are fully evaluated to make sure their blood is safe. This is blood bank blood. Transfusion therapy is the safest it has ever been in the practice of medicine. Before blood is taken from a donor, a complete history is taken to make sure that person has no history of diseases nor engages in risky social behavior (examples are intravenous drug use or sexual activity with multiple partners). The donor's travel history is screened to minimize risk of transmitting infections, such as malaria. The donated blood is tested for signs of infectious diseases, such as HIV and hepatitis. The blood is then tested to be sure it is compatible with you in order to minimize the chance of a transfusion reaction. If you or a relative donates blood, this is often done in anticipation of surgery and is not appropriate for emergency situations. It takes many days to process the donated blood. RISKS AND COMPLICATIONS Although transfusion therapy is very safe and saves many lives, the main dangers of transfusion include:  Getting an infectious disease. Developing a transfusion reaction. This is an allergic reaction to something in the blood you were given. Every precaution is taken to prevent this. The decision to have a blood transfusion has been considered carefully by your caregiver before blood is given. Blood is not given unless the benefits outweigh the risks. AFTER THE TRANSFUSION Right after receiving a blood transfusion, you will usually feel much better and more energetic. This is especially true if your red blood cells have gotten low (anemic). The transfusion raises the level of the red blood cells which carry oxygen, and this usually causes an energy increase. The nurse administering the transfusion will monitor you carefully for complications. HOME CARE  INSTRUCTIONS  No special instructions are needed after a transfusion. You may find your energy is better. Speak with your caregiver about any limitations on activity for underlying diseases you may have. SEEK MEDICAL CARE IF:  Your condition is not improving after your transfusion. You develop redness or irritation at the intravenous (IV) site. SEEK IMMEDIATE MEDICAL CARE IF:  Any of the following symptoms occur over the next 12 hours: Shaking chills. You have a temperature by mouth above 102 F (38.9 C), not controlled by medicine. Chest, back, or muscle pain. People around you feel you are not acting correctly or are confused. Shortness of breath or difficulty breathing. Dizziness and fainting. You get a rash or develop hives. You have a decrease in urine output. Your urine turns a dark color or changes to pink, red, or brown. Any of the following symptoms occur over the next 10 days: You have a temperature by mouth above 102 F (38.9 C), not controlled by medicine. Shortness of breath. Weakness after normal activity. The white part of the eye turns yellow (jaundice). You have a decrease in the amount of urine or are urinating less often. Your  urine turns a dark color or changes to pink, red, or brown. Document Released: 05/31/2000 Document Revised: 08/26/2011 Document Reviewed: 01/18/2008 [facility] Patient Information 2014 [place], [place].  _______________________________________________________________________

## 2023-07-14 NOTE — Progress Notes (Addendum)
PCP - Renford Dills ,MD Cardiologist - no  PPM/ICD -  Device Orders -  Rep Notified -   Chest x-ray -  EKG -  Stress Test -  ECHO - 2021 epic Cardiac Cath -   Sleep Study -  CPAP -   Fasting Blood Sugar -  Checks Blood Sugar _____ times a day  Blood Thinner Instructions:N/A Aspirin Instructions:  ERAS Protcol - PRE-SURGERY n/a   COVID vaccine -yes  Activity--Able to climb a flight of stairs without CP or SOB  Anesthesia review: HTN, DVT/PE  Patient denies shortness of breath, fever, cough and chest pain at PAT appointment   All instructions explained to the patient, with a verbal understanding of the material. Patient agrees to go over the instructions while at home for a better understanding. Patient also instructed to self quarantine after being tested for COVID-19. The opportunity to ask questions was provided.

## 2023-07-21 ENCOUNTER — Encounter (HOSPITAL_COMMUNITY)
Admission: RE | Admit: 2023-07-21 | Discharge: 2023-07-21 | Disposition: A | Discharge status: Home / Self Care | Payer: Commercial Managed Care - PPO | Source: Ambulatory Visit | Attending: Urology | Admitting: Urology

## 2023-07-21 ENCOUNTER — Encounter (HOSPITAL_COMMUNITY): Payer: Self-pay

## 2023-07-21 ENCOUNTER — Other Ambulatory Visit: Payer: Self-pay

## 2023-07-21 VITALS — BP 155/97 | HR 82 | Temp 98.3°F | Resp 16 | Ht 70.0 in | Wt 261.0 lb

## 2023-07-21 DIAGNOSIS — I10 Essential (primary) hypertension: Secondary | ICD-10-CM | POA: Diagnosis not present

## 2023-07-21 DIAGNOSIS — Z01818 Encounter for other preprocedural examination: Secondary | ICD-10-CM | POA: Diagnosis present

## 2023-07-21 DIAGNOSIS — R9431 Abnormal electrocardiogram [ECG] [EKG]: Secondary | ICD-10-CM | POA: Insufficient documentation

## 2023-07-21 HISTORY — DX: Gout, unspecified: M10.9

## 2023-07-21 HISTORY — DX: Other pulmonary embolism without acute cor pulmonale: I26.99

## 2023-07-21 HISTORY — DX: Malignant (primary) neoplasm, unspecified: C80.1

## 2023-07-21 HISTORY — DX: Pneumonia, unspecified organism: J18.9

## 2023-07-21 LAB — BASIC METABOLIC PANEL
Anion gap: 7 (ref 5–15)
BUN: 13 mg/dL (ref 6–20)
CO2: 26 mmol/L (ref 22–32)
Calcium: 8.8 mg/dL — ABNORMAL LOW (ref 8.9–10.3)
Chloride: 105 mmol/L (ref 98–111)
Creatinine, Ser: 1.2 mg/dL (ref 0.61–1.24)
GFR, Estimated: >60 mL/min (ref >60)
Glucose, Bld: 110 mg/dL — ABNORMAL HIGH (ref 70–99)
Potassium: 3.9 mmol/L (ref 3.5–5.1)
Sodium: 138 mmol/L (ref 135–145)

## 2023-07-21 LAB — CBC
HCT: 45.3 % (ref 39.0–52.0)
Hemoglobin: 14.9 g/dL (ref 13.0–17.0)
MCH: 30 pg (ref 26.0–34.0)
MCHC: 32.9 g/dL (ref 30.0–36.0)
MCV: 91.1 fL (ref 80.0–100.0)
Platelets: 235 10*3/uL (ref 150–400)
RBC: 4.97 MIL/uL (ref 4.22–5.81)
RDW: 13.2 % (ref 11.5–15.5)
WBC: 7 10*3/uL (ref 4.0–10.5)
nRBC: 0 % (ref 0.0–0.2)

## 2023-07-25 ENCOUNTER — Encounter (HOSPITAL_COMMUNITY): Payer: Self-pay | Admitting: Urology

## 2023-07-25 NOTE — H&P (Signed)
 Office Visit Report     07/15/2023   --------------------------------------------------------------------------------   Lyrick Lagrand  MRN: [id number]  DOB: [birth year], 58 year old Male  SSN: [id number]   PRIMARY CARE:  Tanda Bame  PRIMARY CARE FAX:  [phone]  REFERRING:  Morene MICAEL Salines, MD  PROVIDER:  Morene Salines, M.D.  TREATING:  Alan Daisy Webb, [place]  LOCATION:  [facility] [phone]     --------------------------------------------------------------------------------   CC/HPI: Pt presents today for pre-operative history and physical exam in anticipation of robotic assisted lap radical prostatectomy with bilateral pelvic lymph node dissection and umbilical hernia repair by Dr. Renda on 07/28/23. He is doing well and is without complaint.   Pt denies F/C, HA, CP, SOB, N/V, diarrhea/constipation, back pain, flank pain, hematuria, and dysuria.    HX:    CC: Prostate Cancer   Physician requesting consult: Dr. Odis Salines  PCP: Dr. Tanda Bame   Mr. Privott is a 58 year old gentleman who was found to have an elevated PSA of 4.96. This prompted an MRI of the prostate on 02/05/23 that indicated a 1.0 cm PI-RADS 3 lesion of the right mid peripheral zone, a 1.2 cm PI-RADS 3 lesion of the left mid base peripheral zone, and a 1.4 cm PI-RADS 4 right anterior transition zone lesion. An MR/US  fusion biopsy of the prostate on 02/20/23 indicated 8 out of 12 systematic biopsy cores positive for Gleason 3+4=7 adenocarcinoma and 6 of 9 targeted biopsies positive for a total of 14 of 21 biopsies positive. He has been counseled by Dr. Salines and also has seen Dr. Tresea at [facility] and radiation oncology.   Family history: None.   Imaging studies: MRI ( 02/05/23) - No EPE, SVI, LAD, or bone lesions.   PMH: He has a history of hypertension and pulmonary embolus (on Eliquis ). It was felt that his pulmonary embolus was related to testosterone  replacement  therapy. He is no longer anticoagulated and no longer takes testosterone  replacement medication.  PSH: No abdominal surgeries.   TNM stage: cT1c N0 Mx  PSA: 4.96  Gleason score: 3+4=7 (GG2 )  Biopsy (02/20/23): 14/21 cores positive  Left: L lateral apex (5%, 3+3=6), L mid (30%, 3+4=7), L lateral base (30%, 3+4=7), L base (50%, 3+4=7, PNI)  Right: R apex (10%, 3+3=6), R lateral apex (10%, 3+4=7), R base (10%, 3+3=6), R lateral base ( 5%, 3+3=6)  ROI-1: Benign  ROI-2: 3/3 cores (30%, 20%, 10%, 3+4=7)  ROI-3: 3/3 cores (30% in one core with 3+4=7 and 60% and 5% in 2 cores with 3+3=6)  Prostate volume: 35 cc   Nomogram  OC disease: 58%  EPE: 41%  SVI: 6%  LNI: 6%  PFS (5 year, 10 year): 80%, 68%   Urinary function: IPSS is 4.  Erectile function: SHIM score is 25.     ALLERGIES: None   MEDICATIONS: Colchicine  Losartan  Potassium     GU PSH: Prostate Needle Biopsy - 02/20/2023     NON-GU PSH: Repair Achilles Tendon Surgical Pathology, Gross And Microscopic Examination For Prostate Needle - 02/20/2023     GU PMH: Stress Incontinence - 07/02/2023 Prostate Cancer - 06/24/2023, - 06/03/2023, - 02/27/2023 Elevated PSA - 02/20/2023, - 12/09/2022, - 08/26/2022 Encounter for sterilization - 2017      PMH Notes: Pulmonary Embolism- on Eliquis  (2020)    1898-06-17 00:00:00 - Note: Normal Routine History And Physical Adult   NON-GU PMH: Muscle weakness (generalized) - 07/02/2023, - 06/24/2023 Other muscle  spasm - 07/02/2023 Gout Hypertension Pulmonary Embolism, History    FAMILY HISTORY: 2 sons - Son   SOCIAL HISTORY: Marital Status: Married Preferred Language: English; Ethnicity: Unknown; Race: Other Race Current Smoking Status: Patient has never smoked.  Does not use smokeless tobacco. Social Drinker.  Does not use drugs. Drinks 1 caffeinated drink per day. Has not had a blood transfusion. 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     Notes: 2 sons  ETOH mixed drink couple on  weekends    REVIEW OF SYSTEMS:    GU Review Male:   Patient denies frequent urination, hard to postpone urination, burning/ pain with urination, get up at night to urinate, leakage of urine, stream starts and stops, trouble starting your stream, have to strain to urinate , erection problems, and penile pain.  Gastrointestinal (Upper):   Patient denies nausea, vomiting, and indigestion/ heartburn.  Gastrointestinal (Lower):   Patient denies diarrhea and constipation.  Constitutional:   Patient denies fever, night sweats, weight loss, and fatigue.  Skin:   Patient denies skin rash/ lesion and itching.  Eyes:   Patient denies double vision and blurred vision.  Ears/ Nose/ Throat:   Patient denies sore throat and sinus problems.  Hematologic/Lymphatic:   Patient denies swollen glands and easy bruising.  Cardiovascular:   Patient denies leg swelling and chest pains.  Respiratory:   Patient denies cough and shortness of breath.  Endocrine:   Patient denies excessive thirst.  Musculoskeletal:   Patient denies back pain and joint pain.  Neurological:   Patient denies headaches and dizziness.  Psychologic:   Patient denies depression and anxiety.   VITAL SIGNS:      07/15/2023 02:24 PM  Weight 250 lb / 113.4 kg  Height 70 in / 177.8 cm  BP 153/82 mmHg  Pulse 67 /min  BMI 35.9 kg/m   MULTI-SYSTEM PHYSICAL EXAMINATION:    Constitutional: Well-nourished. No physical deformities. Normally developed. Good grooming.  Neck: Neck symmetrical, not swollen. Normal tracheal position.  Respiratory: Normal breath sounds. No labored breathing, no use of accessory muscles.   Cardiovascular: Regular rate and rhythm. No murmur, no gallop.  Lymphatic: No enlargement of neck, axillae, groin.  Skin: No paleness, no jaundice, no cyanosis. No lesion, no ulcer, no rash.  Neurologic / Psychiatric: Oriented to time, oriented to place, oriented to person. No depression, no anxiety, no agitation.  Gastrointestinal:  No mass, no tenderness, no rigidity, obese abdomen.   Eyes: Normal conjunctivae. Normal eyelids.  Ears, Nose, Mouth, and Throat: Left ear no scars, no lesions, no masses. Right ear no scars, no lesions, no masses. Nose no scars, no lesions, no masses. Normal hearing. Normal lips.  Musculoskeletal: Normal gait and station of head and neck.     Complexity of Data:  Records Review:   Previous Patient Records  Urine Test Review:   Urinalysis   07/15/23  Urinalysis  Urine Appearance Clear   Urine Color Yellow   Urine Glucose Neg mg/dL  Urine Bilirubin Neg mg/dL  Urine Ketones Neg mg/dL  Urine Specific Gravity 1.025   Urine Blood Neg ery/uL  Urine pH 5.5   Urine Protein Trace mg/dL  Urine Urobilinogen 0.2 mg/dL  Urine Nitrites Neg   Urine Leukocyte Esterase Neg leu/uL   PROCEDURES:          Urinalysis - 81003 Dipstick Dipstick Cont'd  Color: Yellow Bilirubin: Neg mg/dL  Appearance: Clear Ketones: Neg mg/dL  Specific Gravity: 8.974 Blood: Neg ery/uL  pH: 5.5 Protein: Trace mg/dL  Glucose: Neg mg/dL Urobilinogen: 0.2 mg/dL    Nitrites: Neg    Leukocyte Esterase: Neg leu/uL    ASSESSMENT:      ICD-10 Details  1 GU:   Prostate Cancer - C61    PLAN:            Medications Stop Meds: Valium 10 mg tablet 2 tablet PO once take one hour prior to procedure Start: 12/09/2022  Discontinue: 07/15/2023  - Reason: finished           Schedule Return Visit/Planned Activity: Keep Scheduled Appointment - Schedule Surgery          Document Letter(s):  Created for Patient: Clinical Summary         Notes:   There are no changes in the patients history or physical exam since last evaluation by Dr. Renda. Pt is scheduled to undergo RALP with BPLND and umbilical hernia repair on 07/28/23.   All pt's questions were answered to the best of my ability.          Next Appointment:      Next Appointment: 07/28/2023 07:15 AM    Appointment Type: Surgery     Location: [facility] [phone]    Provider: Gretel Renda, M.D.    Reason for Visit: WL/OBS RA LAP RAD PROSTATECTOMY, BPLND, UMBILICAL HERNIA REPAIR SHERMA      * Signed by Alan Clois Hammonds, PA on 07/15/23 at 2:55 PM (EST)*

## 2023-07-26 NOTE — Anesthesia Preprocedure Evaluation (Addendum)
 Anesthesia Evaluation  Patient identified by MRN, date of birth, ID band Patient awake    Reviewed: Allergy & Precautions, NPO status , Patient's Chart, lab work & pertinent test results  Airway Mallampati: II  TM Distance: >3 FB Neck ROM: Full    Dental no notable dental hx.    Pulmonary pneumonia, resolved, neg recent URI   Pulmonary exam normal breath sounds clear to auscultation       Cardiovascular hypertension, Pt. on medications Normal cardiovascular exam Rhythm:Regular Rate:Normal     Neuro/Psych  PSYCHIATRIC DISORDERS      negative neurological ROS     GI/Hepatic negative GI ROS, Neg liver ROS,,,  Endo/Other  negative endocrine ROS    Renal/GU Renal disease     Musculoskeletal negative musculoskeletal ROS (+)    Abdominal  (+) + obese  Peds  Hematology negative hematology ROS (+)   Anesthesia Other Findings   Reproductive/Obstetrics                              Anesthesia Physical Anesthesia Plan  ASA: 3  Anesthesia Plan: General   Post-op Pain Management: Tylenol  PO (pre-op)* and Gabapentin  PO (pre-op)*   Induction: Intravenous  PONV Risk Score and Plan: 4 or greater and Ondansetron , Dexamethasone , Treatment may vary due to age or medical condition and Midazolam   Airway Management Planned: Oral ETT  Additional Equipment:   Intra-op Plan:   Post-operative Plan: Extubation in OR  Informed Consent: I have reviewed the patients History and Physical, chart, labs and discussed the procedure including the risks, benefits and alternatives for the proposed anesthesia with the patient or authorized representative who has indicated his/her understanding and acceptance.     Dental advisory given  Plan Discussed with: CRNA  Anesthesia Plan Comments:         Anesthesia Quick Evaluation

## 2023-07-28 ENCOUNTER — Observation Stay (HOSPITAL_COMMUNITY)
Admission: RE | Admit: 2023-07-28 | Discharge: 2023-07-29 | Disposition: A | Discharge status: Home / Self Care | Payer: Commercial Managed Care - PPO | Attending: Urology | Admitting: Urology

## 2023-07-28 ENCOUNTER — Encounter (HOSPITAL_COMMUNITY)
Admission: RE | Disposition: A | Discharge status: Home / Self Care | Payer: Self-pay | Source: Home / Self Care | Attending: Urology

## 2023-07-28 ENCOUNTER — Encounter (HOSPITAL_COMMUNITY): Payer: Self-pay | Admitting: Urology

## 2023-07-28 ENCOUNTER — Other Ambulatory Visit: Payer: Self-pay

## 2023-07-28 ENCOUNTER — Ambulatory Visit (HOSPITAL_BASED_OUTPATIENT_CLINIC_OR_DEPARTMENT_OTHER): Payer: Commercial Managed Care - PPO | Admitting: Certified Registered Nurse Anesthetist

## 2023-07-28 ENCOUNTER — Ambulatory Visit (HOSPITAL_COMMUNITY): Payer: Commercial Managed Care - PPO | Admitting: Certified Registered Nurse Anesthetist

## 2023-07-28 DIAGNOSIS — I10 Essential (primary) hypertension: Secondary | ICD-10-CM | POA: Insufficient documentation

## 2023-07-28 DIAGNOSIS — Z86711 Personal history of pulmonary embolism: Secondary | ICD-10-CM | POA: Insufficient documentation

## 2023-07-28 DIAGNOSIS — K429 Umbilical hernia without obstruction or gangrene: Secondary | ICD-10-CM | POA: Insufficient documentation

## 2023-07-28 DIAGNOSIS — N183 Chronic kidney disease, stage 3 unspecified: Secondary | ICD-10-CM | POA: Diagnosis not present

## 2023-07-28 DIAGNOSIS — I129 Hypertensive chronic kidney disease with stage 1 through stage 4 chronic kidney disease, or unspecified chronic kidney disease: Secondary | ICD-10-CM

## 2023-07-28 DIAGNOSIS — C61 Malignant neoplasm of prostate: Secondary | ICD-10-CM | POA: Diagnosis present

## 2023-07-28 HISTORY — PX: UMBILICAL HERNIA REPAIR: SHX196

## 2023-07-28 HISTORY — PX: ROBOT ASSISTED LAPAROSCOPIC RADICAL PROSTATECTOMY: SHX5141

## 2023-07-28 HISTORY — PX: LYMPHADENECTOMY: SHX5960

## 2023-07-28 LAB — TYPE AND SCREEN
ABO/RH(D): A POS
Antibody Screen: NEGATIVE

## 2023-07-28 LAB — HEMOGLOBIN AND HEMATOCRIT, BLOOD
HCT: 41.2 % (ref 39.0–52.0)
Hemoglobin: 13.3 g/dL (ref 13.0–17.0)

## 2023-07-28 LAB — ABO/RH: ABO/RH(D): A POS

## 2023-07-28 SURGERY — XI ROBOTIC ASSISTED LAPAROSCOPIC RADICAL PROSTATECTOMY LEVEL 2
Anesthesia: General

## 2023-07-28 MED ORDER — MIDAZOLAM HCL 2 MG/2ML IJ SOLN
INTRAMUSCULAR | Status: AC
  Filled 2023-07-28: qty 2

## 2023-07-28 MED ORDER — DROPERIDOL 2.5 MG/ML IJ SOLN: 0.6250 mg | Freq: Once | INTRAMUSCULAR | Status: DC | PRN

## 2023-07-28 MED ORDER — GABAPENTIN 300 MG PO CAPS
300.0000 mg | ORAL_CAPSULE | Freq: Once | ORAL | Status: AC
  Administered 2023-07-28: 300 mg via ORAL
  Filled 2023-07-28: qty 1

## 2023-07-28 MED ORDER — HYOSCYAMINE SULFATE 0.125 MG SL SUBL: 0.1250 mg | SUBLINGUAL_TABLET | Freq: Four times a day (QID) | SUBLINGUAL | Status: DC | PRN

## 2023-07-28 MED ORDER — KCL IN DEXTROSE-NACL 20-5-0.45 MEQ/L-%-% IV SOLN
INTRAVENOUS | Status: AC
  Filled 2023-07-28: qty 1000

## 2023-07-28 MED ORDER — EPHEDRINE 5 MG/ML INJ
INTRAVENOUS | Status: AC
  Filled 2023-07-28: qty 5

## 2023-07-28 MED ORDER — EPHEDRINE SULFATE (PRESSORS) 50 MG/ML IJ SOLN
INTRAMUSCULAR | Status: DC | PRN
  Administered 2023-07-28 (×3): 10 mg via INTRAVENOUS

## 2023-07-28 MED ORDER — DEXAMETHASONE SODIUM PHOSPHATE 10 MG/ML IJ SOLN
INTRAMUSCULAR | Status: AC
  Filled 2023-07-28: qty 1

## 2023-07-28 MED ORDER — SODIUM CHLORIDE 0.9 % IV BOLUS
1000.0000 mL | Freq: Once | INTRAVENOUS | Status: AC
  Administered 2023-07-28: 1000 mL via INTRAVENOUS

## 2023-07-28 MED ORDER — PROPOFOL 10 MG/ML IV BOLUS
INTRAVENOUS | Status: DC | PRN
  Administered 2023-07-28: 200 mg via INTRAVENOUS

## 2023-07-28 MED ORDER — CHLORHEXIDINE GLUCONATE 0.12 % MT SOLN: 15.0000 mL | Freq: Once | OROMUCOSAL | Status: DC

## 2023-07-28 MED ORDER — DOCUSATE SODIUM 100 MG PO CAPS
100.0000 mg | ORAL_CAPSULE | Freq: Two times a day (BID) | ORAL | Status: DC
  Administered 2023-07-28 – 2023-07-29 (×2): 100 mg via ORAL
  Filled 2023-07-28 (×2): qty 1

## 2023-07-28 MED ORDER — ZOLPIDEM TARTRATE 5 MG PO TABS: 5.0000 mg | ORAL_TABLET | Freq: Every evening | ORAL | Status: DC | PRN

## 2023-07-28 MED ORDER — GLYCOPYRROLATE 0.2 MG/ML IJ SOLN
INTRAMUSCULAR | Status: DC | PRN
  Administered 2023-07-28: .2 mg via INTRAVENOUS

## 2023-07-28 MED ORDER — DEXAMETHASONE SODIUM PHOSPHATE 4 MG/ML IJ SOLN
INTRAMUSCULAR | Status: DC | PRN
  Administered 2023-07-28: 8 mg via INTRAVENOUS

## 2023-07-28 MED ORDER — KCL IN DEXTROSE-NACL 20-5-0.45 MEQ/L-%-% IV SOLN
INTRAVENOUS | Status: DC
  Filled 2023-07-28: qty 1000

## 2023-07-28 MED ORDER — ONDANSETRON HCL 4 MG/2ML IJ SOLN
INTRAMUSCULAR | Status: AC
  Filled 2023-07-28: qty 2

## 2023-07-28 MED ORDER — ROCURONIUM BROMIDE 10 MG/ML (PF) SYRINGE
PREFILLED_SYRINGE | INTRAVENOUS | Status: AC
  Filled 2023-07-28: qty 10

## 2023-07-28 MED ORDER — PROPOFOL 10 MG/ML IV BOLUS
INTRAVENOUS | Status: AC
  Filled 2023-07-28: qty 20

## 2023-07-28 MED ORDER — KETOROLAC TROMETHAMINE 15 MG/ML IJ SOLN
INTRAMUSCULAR | Status: AC
  Filled 2023-07-28: qty 1

## 2023-07-28 MED ORDER — ACETAMINOPHEN 325 MG PO TABS
650.0000 mg | ORAL_TABLET | ORAL | Status: DC | PRN
  Administered 2023-07-28 – 2023-07-29 (×2): 650 mg via ORAL
  Filled 2023-07-28 (×2): qty 2

## 2023-07-28 MED ORDER — CEFAZOLIN SODIUM-DEXTROSE 2-4 GM/100ML-% IV SOLN
2.0000 g | INTRAVENOUS | Status: AC
  Administered 2023-07-28: 2 g via INTRAVENOUS
  Filled 2023-07-28: qty 100

## 2023-07-28 MED ORDER — ORAL CARE MOUTH RINSE: 15.0000 mL | Freq: Once | OROMUCOSAL | Status: DC

## 2023-07-28 MED ORDER — FENTANYL CITRATE (PF) 250 MCG/5ML IJ SOLN
INTRAMUSCULAR | Status: AC
  Filled 2023-07-28: qty 5

## 2023-07-28 MED ORDER — CEFAZOLIN SODIUM-DEXTROSE 1-4 GM/50ML-% IV SOLN
1.0000 g | Freq: Three times a day (TID) | INTRAVENOUS | Status: AC
  Administered 2023-07-28 – 2023-07-29 (×2): 1 g via INTRAVENOUS
  Filled 2023-07-28 (×2): qty 50

## 2023-07-28 MED ORDER — TRAMADOL HCL 50 MG PO TABS: 50.0000 mg | ORAL_TABLET | Freq: Four times a day (QID) | ORAL | 0 refills | Status: DC | PRN

## 2023-07-28 MED ORDER — PHENYLEPHRINE HCL-NACL 20-0.9 MG/250ML-% IV SOLN
INTRAVENOUS | Status: AC
  Filled 2023-07-28: qty 250

## 2023-07-28 MED ORDER — DEXMEDETOMIDINE HCL IN NACL 80 MCG/20ML IV SOLN
INTRAVENOUS | Status: AC
  Filled 2023-07-28: qty 20

## 2023-07-28 MED ORDER — ONDANSETRON HCL 4 MG/2ML IJ SOLN
INTRAMUSCULAR | Status: DC | PRN
  Administered 2023-07-28: 4 mg via INTRAVENOUS

## 2023-07-28 MED ORDER — TRIPLE ANTIBIOTIC 3.5-400-5000 EX OINT: 1.0000 | TOPICAL_OINTMENT | Freq: Three times a day (TID) | CUTANEOUS | Status: DC | PRN

## 2023-07-28 MED ORDER — DIPHENHYDRAMINE HCL 50 MG/ML IJ SOLN: 12.5000 mg | Freq: Four times a day (QID) | INTRAMUSCULAR | Status: DC | PRN

## 2023-07-28 MED ORDER — ROCURONIUM BROMIDE 100 MG/10ML IV SOLN
INTRAVENOUS | Status: DC | PRN
  Administered 2023-07-28: 80 mg via INTRAVENOUS
  Administered 2023-07-28 (×3): 10 mg via INTRAVENOUS

## 2023-07-28 MED ORDER — BUPIVACAINE-EPINEPHRINE 0.25% -1:200000 IJ SOLN
INTRAMUSCULAR | Status: DC | PRN
  Administered 2023-07-28: 30 mL

## 2023-07-28 MED ORDER — HYDROMORPHONE HCL 1 MG/ML IJ SOLN
INTRAMUSCULAR | Status: AC
  Filled 2023-07-28: qty 1

## 2023-07-28 MED ORDER — MORPHINE SULFATE (PF) 2 MG/ML IV SOLN: 2.0000 mg | INTRAVENOUS | Status: DC | PRN

## 2023-07-28 MED ORDER — HYDROMORPHONE HCL 1 MG/ML IJ SOLN
0.2500 mg | INTRAMUSCULAR | Status: DC | PRN
  Administered 2023-07-28 (×4): 0.5 mg via INTRAVENOUS

## 2023-07-28 MED ORDER — LIDOCAINE HCL (PF) 2 % IJ SOLN
INTRAMUSCULAR | Status: AC
  Filled 2023-07-28: qty 5

## 2023-07-28 MED ORDER — LACTATED RINGERS IV SOLN: INTRAVENOUS | Status: DC

## 2023-07-28 MED ORDER — ACETAMINOPHEN 500 MG PO TABS
1000.0000 mg | ORAL_TABLET | Freq: Once | ORAL | Status: AC
  Administered 2023-07-28: 1000 mg via ORAL
  Filled 2023-07-28: qty 2

## 2023-07-28 MED ORDER — FLEET ENEMA RE ENEM: 1.0000 | ENEMA | Freq: Once | RECTAL | Status: DC

## 2023-07-28 MED ORDER — LOSARTAN POTASSIUM 50 MG PO TABS
50.0000 mg | ORAL_TABLET | Freq: Every day | ORAL | Status: DC
  Administered 2023-07-28 – 2023-07-29 (×2): 50 mg via ORAL
  Filled 2023-07-28 (×2): qty 1

## 2023-07-28 MED ORDER — LIDOCAINE HCL (CARDIAC) PF 100 MG/5ML IV SOSY
PREFILLED_SYRINGE | INTRAVENOUS | Status: DC | PRN
  Administered 2023-07-28: 100 mg via INTRAVENOUS

## 2023-07-28 MED ORDER — DIPHENHYDRAMINE HCL 12.5 MG/5ML PO ELIX: 12.5000 mg | ORAL_SOLUTION | Freq: Four times a day (QID) | ORAL | Status: DC | PRN

## 2023-07-28 MED ORDER — KETOROLAC TROMETHAMINE 15 MG/ML IJ SOLN
15.0000 mg | Freq: Four times a day (QID) | INTRAMUSCULAR | Status: DC
  Administered 2023-07-28 – 2023-07-29 (×5): 15 mg via INTRAVENOUS
  Filled 2023-07-28 (×4): qty 1

## 2023-07-28 MED ORDER — BUPIVACAINE-EPINEPHRINE 0.25% -1:200000 IJ SOLN
INTRAMUSCULAR | Status: AC
  Filled 2023-07-28: qty 1

## 2023-07-28 MED ORDER — SULFAMETHOXAZOLE-TRIMETHOPRIM 800-160 MG PO TABS: 1.0000 | ORAL_TABLET | Freq: Two times a day (BID) | ORAL | 0 refills | Status: DC

## 2023-07-28 MED ORDER — LACTATED RINGERS IV SOLN: INTRAVENOUS | Status: DC | PRN

## 2023-07-28 MED ORDER — STERILE WATER FOR IRRIGATION IR SOLN
Status: DC | PRN
  Administered 2023-07-28: 1000 mL

## 2023-07-28 MED ORDER — DOCUSATE SODIUM 100 MG PO CAPS: 100.0000 mg | ORAL_CAPSULE | Freq: Two times a day (BID) | ORAL | Status: DC

## 2023-07-28 MED ORDER — SUGAMMADEX SODIUM 200 MG/2ML IV SOLN
INTRAVENOUS | Status: DC | PRN
  Administered 2023-07-28: 200 mg via INTRAVENOUS

## 2023-07-28 MED ORDER — MAGNESIUM CITRATE PO SOLN: 1.0000 | Freq: Once | ORAL | Status: DC

## 2023-07-28 MED ORDER — FENTANYL CITRATE (PF) 100 MCG/2ML IJ SOLN
INTRAMUSCULAR | Status: DC | PRN
  Administered 2023-07-28: 100 ug via INTRAVENOUS
  Administered 2023-07-28: 25 ug via INTRAVENOUS
  Administered 2023-07-28 (×2): 50 ug via INTRAVENOUS

## 2023-07-28 MED ORDER — MIDAZOLAM HCL 5 MG/5ML IJ SOLN
INTRAMUSCULAR | Status: DC | PRN
  Administered 2023-07-28: 2 mg via INTRAVENOUS

## 2023-07-28 MED ORDER — ONDANSETRON HCL 4 MG/2ML IJ SOLN: 4.0000 mg | INTRAMUSCULAR | Status: DC | PRN

## 2023-07-28 SURGICAL SUPPLY — 60 items
APPLICATOR COTTON TIP 6 STRL (MISCELLANEOUS) ×2 IMPLANT
APPLICATOR COTTON TIP 6IN STRL (MISCELLANEOUS) ×2 IMPLANT
BAG COUNTER SPONGE SURGICOUNT (BAG) IMPLANT
CATH FOLEY 2WAY SLVR 18FR 30CC (CATHETERS) ×2 IMPLANT
CATH ROBINSON RED A/P 16FR (CATHETERS) ×2 IMPLANT
CATH ROBINSON RED A/P 8FR (CATHETERS) ×2 IMPLANT
CATH TIEMANN FOLEY 18FR 5CC (CATHETERS) ×2 IMPLANT
CHLORAPREP W/TINT 26 (MISCELLANEOUS) ×2 IMPLANT
CLIP LIGATING HEM O LOK PURPLE (MISCELLANEOUS) ×2 IMPLANT
COVER SURGICAL LIGHT HANDLE (MISCELLANEOUS) ×2 IMPLANT
COVER TIP SHEARS 8 DVNC (MISCELLANEOUS) ×2 IMPLANT
CUTTER ECHEON FLEX ENDO 45 340 (ENDOMECHANICALS) ×2 IMPLANT
DERMABOND ADVANCED .7 DNX12 (GAUZE/BANDAGES/DRESSINGS) ×2 IMPLANT
DRAIN CHANNEL RND F F (WOUND CARE) IMPLANT
DRAPE ARM DVNC X/XI (DISPOSABLE) ×8 IMPLANT
DRAPE COLUMN DVNC XI (DISPOSABLE) ×2 IMPLANT
DRAPE SURG IRRIG POUCH 19X23 (DRAPES) ×2 IMPLANT
DRIVER NDL LRG 8 DVNC XI (INSTRUMENTS) ×4 IMPLANT
DRIVER NDLE LRG 8 DVNC XI (INSTRUMENTS) ×4 IMPLANT
DRSG TEGADERM 4X4.75 (GAUZE/BANDAGES/DRESSINGS) ×2 IMPLANT
ELECT PENCIL ROCKER SW 15FT (MISCELLANEOUS) ×2 IMPLANT
ELECT REM PT RETURN 15FT ADLT (MISCELLANEOUS) ×2 IMPLANT
FORCEPS BPLR LNG DVNC XI (INSTRUMENTS) ×2 IMPLANT
FORCEPS PROGRASP DVNC XI (FORCEP) ×2 IMPLANT
GAUZE SPONGE 4X4 12PLY STRL (GAUZE/BANDAGES/DRESSINGS) ×2 IMPLANT
GLOVE BIO SURGEON STRL SZ 6.5 (GLOVE) ×2 IMPLANT
GLOVE SURG LX STRL 7.5 STRW (GLOVE) ×4 IMPLANT
GOWN STRL REUS W/ TWL XL LVL3 (GOWN DISPOSABLE) ×4 IMPLANT
GOWN STRL SURGICAL XL XLNG (GOWN DISPOSABLE) ×2 IMPLANT
HOLDER FOLEY CATH W/STRAP (MISCELLANEOUS) ×2 IMPLANT
IRRIG SUCT STRYKERFLOW 2 WTIP (MISCELLANEOUS) ×2
IRRIGATION SUCT STRKRFLW 2 WTP (MISCELLANEOUS) ×2 IMPLANT
IV LACTATED RINGERS 1000ML (IV SOLUTION) ×2 IMPLANT
KIT TURNOVER KIT A (KITS) IMPLANT
NDL SAFETY ECLIPSE 18X1.5 (NEEDLE) IMPLANT
PACK ROBOT UROLOGY CUSTOM (CUSTOM PROCEDURE TRAY) ×2 IMPLANT
PLUG CATH AND CAP STRL 200 (CATHETERS) ×2 IMPLANT
RELOAD STAPLE 45 4.1 GRN THCK (STAPLE) ×2 IMPLANT
SCISSORS MNPLR CVD DVNC XI (INSTRUMENTS) ×2 IMPLANT
SEAL UNIV 5-12 XI (MISCELLANEOUS) ×8 IMPLANT
SET CYSTO W/LG BORE CLAMP LF (SET/KITS/TRAYS/PACK) IMPLANT
SET TUBE SMOKE EVAC HIGH FLOW (TUBING) ×2 IMPLANT
SOL ELECTROSURG ANTI STICK (MISCELLANEOUS) ×2
SOL PREP POV-IOD 4OZ 10% (MISCELLANEOUS) ×2 IMPLANT
SOLUTION ELECTROSURG ANTI STCK (MISCELLANEOUS) ×2 IMPLANT
SPIKE FLUID TRANSFER (MISCELLANEOUS) ×2 IMPLANT
STAPLE RELOAD 45 GRN (STAPLE) ×2 IMPLANT
SUT ETHILON 3 0 PS 1 (SUTURE) ×2 IMPLANT
SUT MNCRL 3 0 RB1 (SUTURE) ×2 IMPLANT
SUT MNCRL 3 0 VIOLET RB1 (SUTURE) ×2 IMPLANT
SUT MNCRL AB 4-0 PS2 18 (SUTURE) ×4 IMPLANT
SUT NOVA NAB DX-16 0-1 5-0 T12 (SUTURE) IMPLANT
SUT PDS PLUS AB 0 CT-2 (SUTURE) ×4 IMPLANT
SUT VIC AB 0 CT1 27XBRD ANTBC (SUTURE) ×4 IMPLANT
SUT VIC AB 2-0 SH 27X BRD (SUTURE) ×2 IMPLANT
SUT VIC AB 3-0 SH 27X BRD (SUTURE) IMPLANT
SUT VIC AB 3-0 SH 27XBRD (SUTURE) IMPLANT
SYR 27GX1/2 1ML LL SAFETY (SYRINGE) ×2 IMPLANT
TROCAR Z THREAD OPTICAL 12X100 (TROCAR) IMPLANT
WATER STERILE IRR 1000ML POUR (IV SOLUTION) ×2 IMPLANT

## 2023-07-28 NOTE — Interval H&P Note (Signed)
 History and Physical Interval Note:  07/28/2023 6:57 AM  Ricky Bentley  has presented today for surgery, with the diagnosis of PROSTATE CANCER, UMBILICAL HERNIA.  The various methods of treatment have been discussed with the patient and family. After consideration of risks, benefits and other options for treatment, the patient has consented to  Procedure(s) with comments: XI ROBOTIC ASSISTED LAPAROSCOPIC RADICAL PROSTATECTOMY LEVEL 2 (N/A) - 210 MINUTES NEEDED FOR CASE BILATERAL PELVIC LYMPHADENECTOMY (Bilateral) HERNIA REPAIR UMBILICAL ADULT (N/A) as a surgical intervention.  The patient's history has been reviewed, patient examined, no change in status, stable for surgery.  I have reviewed the patient's chart and labs.  Questions were answered to the patient's satisfaction.     [employer]

## 2023-07-28 NOTE — Transfer of Care (Signed)
 Immediate Anesthesia Transfer of Care Note  Patient: Ricky Bentley  Procedure(s) Performed: XI ROBOTIC ASSISTED LAPAROSCOPIC RADICAL PROSTATECTOMY LEVEL 2 BILATERAL PELVIC LYMPHADENECTOMY (Bilateral) HERNIA REPAIR UMBILICAL ADULT  Patient Location: PACU  Anesthesia Type:General  Level of Consciousness: awake, alert , and oriented  Airway & Oxygen Therapy: Patient Spontanous Breathing and Patient connected to face mask oxygen  Post-op Assessment: Report given to RN and Post -op Vital signs reviewed and stable  Post vital signs: Reviewed and stable  Last Vitals:  Vitals Value Taken Time  BP 133/71 07/28/23 1015  Temp    Pulse 52 07/28/23 1019  Resp 19 07/28/23 1019  SpO2 98 % 07/28/23 1019  Vitals shown include unfiled device data.  Last Pain:  Vitals:   07/28/23 0617  TempSrc: Oral  PainSc:          Complications: No notable events documented.

## 2023-07-28 NOTE — Anesthesia Procedure Notes (Addendum)
 Procedure Name: Intubation Date/Time: 07/28/2023 7:25 AM  Performed by: Elvia Hammans, CRNAPre-anesthesia Checklist: Patient identified, Emergency Drugs available, Suction available and Patient being monitored Patient Re-evaluated:Patient Re-evaluated prior to induction Oxygen Delivery Method: Circle system utilized Preoxygenation: Pre-oxygenation with 100% oxygen Induction Type: IV induction Ventilation: Mask ventilation without difficulty Laryngoscope Size: Glidescope and 4 Tube type: Oral Tube size: 7.5 mm Number of attempts: 1 Airway Equipment and Method: Stylet, Oral airway and Video-laryngoscopy Placement Confirmation: ETT inserted through vocal cords under direct vision, positive ETCO2 and breath sounds checked- equal and bilateral Secured at: 24 cm Tube secured with: Tape Dental Injury: Teeth and Oropharynx as per pre-operative assessment

## 2023-07-28 NOTE — Op Note (Signed)
 Preoperative diagnosis: Clinically localized adenocarcinoma of the prostate (clinical stage T1c N0 Mx)  Postoperative diagnosis: Clinically localized adenocarcinoma of the prostate (clinical stage T1c N0 Mx)  Procedure:  Robotic assisted laparoscopic radical prostatectomy (bilateral nerve sparing) Bilateral robotic assisted laparoscopic pelvic lymphadenectomy Umbilical hernia repair  Surgeon: Izetta Marshall. M.D.  Assistant: Carrolyn Clan, PA-C  An assistant was required for this surgical procedure.  The duties of the assistant included but were not limited to suctioning, passing suture, camera manipulation, retraction. This procedure would not be able to be performed without an Geophysicist/field seismologist.  Anesthesia: General  Complications: None  EBL: 100 mL  IVF:  1000 mL crystalloid  Specimens: Prostate and seminal vesicles Right pelvic lymph nodes Left pelvic lymph nodes  Disposition of specimens: Pathology  Drains: 20 Fr coude catheter # 19 Blake pelvic drain  Indication: Ricky Bentley is a 58 y.o. year old patient with clinically localized prostate cancer.  After a thorough review of the management options for treatment of prostate cancer, he elected to proceed with surgical therapy and the above procedure(s).  We have discussed the potential benefits and risks of the procedure, side effects of the proposed treatment, the likelihood of the patient achieving the goals of the procedure, and any potential problems that might occur during the procedure or recuperation. Informed consent has been obtained.  Description of procedure:  The patient was taken to the operating room and a general anesthetic was administered. He was given preoperative antibiotics, placed in the dorsal lithotomy position, and prepped and draped in the usual sterile fashion. Next a preoperative timeout was performed. A urethral catheter was placed into the bladder and a site was selected near the umbilicus for  placement of the camera port. This was placed using a standard open Hassan technique which allowed entry into the peritoneal cavity under direct vision and without difficulty. An 8 mm robotic port was placed and a pneumoperitoneum established. The camera was then used to inspect the abdomen and there was no evidence of any intra-abdominal injuries or other abnormalities. The remaining abdominal ports were then placed. 8 mm robotic ports were placed in the right lower quadrant, left lower quadrant, and far left lateral abdominal wall. A 5 mm port was placed in the right upper quadrant and a 12 mm port was placed in the right lateral abdominal wall for laparoscopic assistance. All ports were placed under direct vision without difficulty. The surgical cart was then docked.   Utilizing the cautery scissors, the bladder was reflected posteriorly allowing entry into the space of Retzius and identification of the endopelvic fascia and prostate. The periprostatic fat was then removed from the prostate allowing full exposure of the endopelvic fascia. The endopelvic fascia was then incised from the apex back to the base of the prostate bilaterally and the underlying levator muscle fibers were swept laterally off the prostate thereby isolating the dorsal venous complex. The dorsal vein was then stapled and divided with a 45 mm Flex Echelon stapler. Attention then turned to the bladder neck which was divided anteriorly thereby allowing entry into the bladder and exposure of the urethral catheter. The catheter balloon was deflated and the catheter was brought into the operative field and used to retract the prostate anteriorly. The posterior bladder neck was then examined and was divided allowing further dissection between the bladder and prostate posteriorly until the vasa deferentia and seminal vessels were identified. The vasa deferentia were isolated, divided, and lifted anteriorly. The seminal vesicles were  dissected down  to their tips with care to control the seminal vascular arterial blood supply. These structures were then lifted anteriorly and the space between Denonvillier's fascia and the anterior rectum was developed with a combination of sharp and blunt dissection. This isolated the vascular pedicles of the prostate.  The lateral prostatic fascia was then sharply incised allowing release of the neurovascular bundles bilaterally. The vascular pedicles of the prostate were then ligated with Weck clips between the prostate and neurovascular bundles and divided with sharp cold scissor dissection resulting in neurovascular bundle preservation. The neurovascular bundles were then separated off the apex of the prostate and urethra bilaterally.  The urethra was then sharply transected allowing the prostate specimen to be disarticulated. The pelvis was copiously irrigated and hemostasis was ensured. There was no evidence for rectal injury.  Attention then turned to the right pelvic sidewall. The fibrofatty tissue between the external iliac vein, confluence of the iliac vessels, hypogastric artery, and Cooper's ligament was dissected free from the pelvic sidewall with care to preserve the obturator nerve. Weck clips were used for lymphostasis and hemostasis. An identical procedure was performed on the contralateral side and the lymphatic packets were removed for permanent pathologic analysis.  Attention then turned to the urethral anastomosis. A 2-0 Vicryl slip knot was placed between Denonvillier's fascia, the posterior bladder neck, and the posterior urethra to reapproximate these structures. A double-armed 3-0 Monocryl suture was then used to perform a 360 running tension-free anastomosis between the bladder neck and urethra. A new urethral catheter was then placed into the bladder and irrigated. There were no blood clots within the bladder and the anastomosis appeared to be watertight. A #19 Blake drain was then brought  through the left lateral 8 mm port site and positioned appropriately within the pelvis. It was secured to the skin with a nylon suture. The surgical cart was then undocked. The right lateral 12 mm port site was closed at the fascial level with a 0 Vicryl suture placed laparoscopically. All remaining ports were then removed under direct vision. The prostate specimen was removed intact within the Endopouch retrieval bag via the periumbilical camera port site. This fascial opening was then extended around the umbilicus.  A small umbilical hernia was identified and the hernia sac was excised with electrocautery.  This fascial defect including the extraction site was then closed with figure of eight #1 Novofil sutures to repair the hernia and close the extraction site. 0.25% Marcaine  was then injected into all port sites and all incisions were reapproximated at the skin level with 4-0 Monocryl subcuticular sutures and Dermabond. The patient appeared to tolerate the procedure well and without complications. The patient was able to be extubated and transferred to the recovery unit in satisfactory condition.   Izetta Marshall MD

## 2023-07-28 NOTE — Discharge Instructions (Signed)

## 2023-07-28 NOTE — Progress Notes (Signed)
 Patient ID: Ricky Bentley, male   DOB: [birth year], 58 y.o.   MRN: [id number]  Post-op note  Subjective: The patient is doing well.  No complaints.  Objective: Vital signs in last 24 hours: Temp:  [97.1 F (36.2 C)-98.3 F (36.8 C)] 97.8 F (36.6 C) (02/10 1448) Pulse Rate:  [48-88] 88 (02/10 1448) Resp:  [13-26] 18 (02/10 1448) BP: (117-152)/(66-99) 152/85 (02/10 1448) SpO2:  [92 %-100 %] 96 % (02/10 1448) Weight:  [118.4 kg] 118.4 kg (02/10 0542)  Intake/Output from previous day: No intake/output data recorded. Intake/Output this shift: Total I/O In: 1000 [I.V.:1000] Out: 455 [Urine:300; Drains:55; Blood:100]  Physical Exam:  General: Alert and oriented. Abdomen: Soft, Nondistended. Incisions: Clean and dry. GU: Urine clear.  Lab Results: Recent Labs    07/28/23 1145  HGB 13.3  HCT 41.2    Assessment/Plan: POD#0   1) Continue to monitor, ambulate, IS   Izetta Marshall. MD   LOS: 0 days   Kristeen Peto 07/28/2023, 4:36 PM

## 2023-07-29 ENCOUNTER — Encounter (HOSPITAL_COMMUNITY): Payer: Self-pay | Admitting: Urology

## 2023-07-29 DIAGNOSIS — C61 Malignant neoplasm of prostate: Secondary | ICD-10-CM | POA: Diagnosis not present

## 2023-07-29 LAB — HEMOGLOBIN AND HEMATOCRIT, BLOOD
HCT: 39.3 % (ref 39.0–52.0)
Hemoglobin: 12.8 g/dL — ABNORMAL LOW (ref 13.0–17.0)

## 2023-07-29 MED ORDER — CHLORHEXIDINE GLUCONATE CLOTH 2 % EX PADS
6.0000 | MEDICATED_PAD | Freq: Every day | CUTANEOUS | Status: DC
  Administered 2023-07-29: 6 via TOPICAL

## 2023-07-29 MED ORDER — SIMETHICONE 80 MG PO CHEW
80.0000 mg | CHEWABLE_TABLET | Freq: Four times a day (QID) | ORAL | Status: DC | PRN
  Administered 2023-07-29: 80 mg via ORAL
  Filled 2023-07-29: qty 1

## 2023-07-29 MED ORDER — TRAMADOL HCL 50 MG PO TABS: 50.0000 mg | ORAL_TABLET | Freq: Four times a day (QID) | ORAL | Status: DC | PRN

## 2023-07-29 MED ORDER — BISACODYL 10 MG RE SUPP
10.0000 mg | Freq: Once | RECTAL | Status: AC
  Administered 2023-07-29: 10 mg via RECTAL
  Filled 2023-07-29: qty 1

## 2023-07-29 NOTE — Progress Notes (Signed)
Patient ID: Ricky Bentley, male   DOB: [birth year], 58 y.o.   MRN: [id number]  1 Day Post-Op Subjective: The patient is doing well.  No nausea or vomiting. Pain is adequately controlled.  Objective: Vital signs in last 24 hours: Temp:  [97.1 F (36.2 C)-98.6 F (37 C)] 98.5 F (36.9 C) (02/11 0626) Pulse Rate:  [48-88] 62 (02/11 0626) Resp:  [13-26] 17 (02/11 0626) BP: (117-152)/(66-85) 134/82 (02/11 0626) SpO2:  [92 %-100 %] 96 % (02/11 0626)  Intake/Output from previous day: 02/10 0701 - 02/11 0700 In: 1000 [I.V.:1000] Out: 785 [Urine:600; Drains:85; Blood:100] Intake/Output this shift: No intake/output data recorded.  Physical Exam:  General: Alert and oriented. CV: RRR Lungs: Clear bilaterally. GI: Soft, Nondistended. Incisions: Clean, dry, and intact Urine: Clear Extremities: Nontender, no erythema, no edema.  Lab Results: Recent Labs    07/28/23 1145 07/29/23 0530  HGB 13.3 12.8*  HCT 41.2 39.3      Assessment/Plan: POD# 1 s/p robotic prostatectomy.  1) SL IVF 2) Ambulate, Incentive spirometry 3) Transition to oral pain medication 4) Dulcolax suppository 5) D/C pelvic drain 6) Plan for likely discharge later today   [name]. MD   LOS: 0 days   [name] 07/29/2023, 7:54 AM

## 2023-07-29 NOTE — Anesthesia Postprocedure Evaluation (Signed)
Anesthesia Post Note  Patient: Ricky Bentley  Procedure(s) Performed: XI ROBOTIC ASSISTED LAPAROSCOPIC RADICAL PROSTATECTOMY LEVEL 2 BILATERAL PELVIC LYMPHADENECTOMY (Bilateral) HERNIA REPAIR UMBILICAL ADULT     Patient location during evaluation: PACU Anesthesia Type: General Level of consciousness: sedated and patient cooperative Pain management: pain level controlled Vital Signs Assessment: post-procedure vital signs reviewed and stable Respiratory status: spontaneous breathing Cardiovascular status: stable Anesthetic complications: no   No notable events documented.  Last Vitals:  Vitals:   07/28/23 2230 07/29/23 0626  BP: (!) 140/79 134/82  Pulse: 62 62  Resp: 19 17  Temp: 37 C 36.9 C  SpO2: 97% 96%    Last Pain:  Vitals:   07/29/23 1050  TempSrc:   PainSc: 2                  Lewie Loron

## 2023-07-29 NOTE — Discharge Summary (Signed)
  Date of admission: 07/28/2023  Date of discharge: 07/29/2023  Admission diagnosis: Prostate Cancer  Discharge diagnosis: Prostate Cancer  History and Physical: For full details, please see admission history and physical. Briefly, Ricky Bentley is a 58 y.o. gentleman with localized prostate cancer.  After discussing management/treatment options, he elected to proceed with surgical treatment.  Hospital Course: BRYEN HINDERMAN was taken to the operating room on 07/28/2023 and underwent a robotic assisted laparoscopic radical prostatectomy. He tolerated this procedure well and without complications. Postoperatively, he was able to be transferred to a regular hospital room following recovery from anesthesia.  He was able to begin ambulating the night of surgery. He remained hemodynamically stable overnight.  He had excellent urine output with appropriately minimal output from his pelvic drain and his pelvic drain was removed on POD #1.  He was transitioned to oral pain medication, tolerated a clear liquid diet, and had met all discharge criteria and was able to be discharged home later on POD#1.  Laboratory values:  Recent Labs    07/28/23 1145 07/29/23 0530  HGB 13.3 12.8*  HCT 41.2 39.3    Disposition: Home  Discharge instruction: He was instructed to be ambulatory but to refrain from heavy lifting, strenuous activity, or driving. He was instructed on urethral catheter care.  Discharge medications:   Allergies as of 07/29/2023   No Known Allergies      Medication List     TAKE these medications    colchicine 0.6 MG tablet Take 0.6 mg by mouth daily as needed (gout pain).   docusate sodium 100 MG capsule Commonly known as: COLACE Take 1 capsule (100 mg total) by mouth 2 (two) times daily.   losartan 50 MG tablet Commonly known as: COZAAR Take 50 mg by mouth daily.   sulfamethoxazole-trimethoprim 800-160 MG tablet Commonly known as: BACTRIM DS Take 1 tablet by mouth 2  (two) times daily. Start the day prior to foley removal appointment   traMADol 50 MG tablet Commonly known as: Ultram Take 1-2 tablets (50-100 mg total) by mouth every 6 (six) hours as needed for moderate pain (pain score 4-6) or severe pain (pain score 7-10).        Followup: He will followup in 1 week for catheter removal and to discuss his surgical pathology results.

## 2023-07-29 NOTE — Plan of Care (Signed)
  Problem: Pain Management: Goal: General experience of comfort will improve Outcome: Progressing   Problem: Skin Integrity: Goal: Demonstration of wound healing without infection will improve Outcome: Progressing   Problem: Education: Goal: Knowledge of General Education information will improve Description: Including pain rating scale, medication(s)/side effects and non-pharmacologic comfort measures Outcome: Progressing

## 2023-07-31 LAB — SURGICAL PATHOLOGY

## 2023-08-11 NOTE — Telephone Encounter (Addendum)
 In care everywhere patient had surgery at [facility]  ----- Message from Candis Rake, RN sent at 07/14/2023  4:51 PM EST ----- Mychart message sent ----- Message ----- From: Rake Candis ORN, RN Sent: 05/28/2023   1:03 PM EST To: Candis ORN Rake, RN  I left a voicemail --thank you Candis ----- Message ----- From: Rake Candis ORN, RN Sent: 04/30/2023   1:49 PM EST To: Candis ORN Rake, RN  Ultimately, the patient, after a careful discussion of his preferences for relevant outcomes and concern about adverse effects, would like to take some time to think about these options further. Would plan for SP transvesical if he wishes for surgery. Blood and urine tests today. Will have nurse navigator reach out in 1-2 weeks

## 2024-02-05 ENCOUNTER — Emergency Department (HOSPITAL_COMMUNITY)

## 2024-02-05 ENCOUNTER — Observation Stay (HOSPITAL_COMMUNITY)
Admission: EM | Admit: 2024-02-05 | Discharge: 2024-02-06 | Disposition: A | Discharge status: Home / Self Care | Attending: Family Medicine | Admitting: Family Medicine

## 2024-02-05 ENCOUNTER — Encounter (HOSPITAL_COMMUNITY): Payer: Self-pay | Admitting: Emergency Medicine

## 2024-02-05 DIAGNOSIS — I471 Supraventricular tachycardia, unspecified: Secondary | ICD-10-CM | POA: Diagnosis not present

## 2024-02-05 DIAGNOSIS — R7989 Other specified abnormal findings of blood chemistry: Secondary | ICD-10-CM | POA: Diagnosis not present

## 2024-02-05 DIAGNOSIS — N1831 Chronic kidney disease, stage 3a: Secondary | ICD-10-CM | POA: Diagnosis not present

## 2024-02-05 DIAGNOSIS — E66812 Obesity, class 2: Secondary | ICD-10-CM | POA: Diagnosis not present

## 2024-02-05 DIAGNOSIS — Z86711 Personal history of pulmonary embolism: Secondary | ICD-10-CM | POA: Insufficient documentation

## 2024-02-05 DIAGNOSIS — Z8546 Personal history of malignant neoplasm of prostate: Secondary | ICD-10-CM | POA: Insufficient documentation

## 2024-02-05 DIAGNOSIS — I129 Hypertensive chronic kidney disease with stage 1 through stage 4 chronic kidney disease, or unspecified chronic kidney disease: Secondary | ICD-10-CM | POA: Insufficient documentation

## 2024-02-05 DIAGNOSIS — R000 Tachycardia, unspecified: Secondary | ICD-10-CM | POA: Diagnosis present

## 2024-02-05 LAB — TROPONIN I (HIGH SENSITIVITY)
Troponin I (High Sensitivity): 9 ng/L (ref <18)
Troponin I (High Sensitivity): 337 ng/L (ref <18)
Troponin I (High Sensitivity): 215 ng/L (ref <18)

## 2024-02-05 LAB — CBC WITH DIFFERENTIAL/PLATELET
Abs Immature Granulocytes: 0.03 K/uL (ref 0.00–0.07)
Basophils Absolute: 0.1 K/uL (ref 0.0–0.1)
Basophils Relative: 1 %
Eosinophils Absolute: 0.1 K/uL (ref 0.0–0.5)
Eosinophils Relative: 1 %
HCT: 49.3 % (ref 39.0–52.0)
Hemoglobin: 16.4 g/dL (ref 13.0–17.0)
Immature Granulocytes: 0 %
Lymphocytes Relative: 33 %
Lymphs Abs: 2.6 K/uL (ref 0.7–4.0)
MCH: 29.7 pg (ref 26.0–34.0)
MCHC: 33.3 g/dL (ref 30.0–36.0)
MCV: 89.3 fL (ref 80.0–100.0)
Monocytes Absolute: 0.8 K/uL (ref 0.1–1.0)
Monocytes Relative: 11 %
Neutro Abs: 4.2 K/uL (ref 1.7–7.7)
Neutrophils Relative %: 54 %
Platelets: 312 K/uL (ref 150–400)
RBC: 5.52 MIL/uL (ref 4.22–5.81)
RDW: 13.3 % (ref 11.5–15.5)
WBC: 7.7 K/uL (ref 4.0–10.5)
nRBC: 0 % (ref 0.0–0.2)

## 2024-02-05 LAB — BASIC METABOLIC PANEL WITH GFR
Anion gap: 14 (ref 5–15)
BUN: 18 mg/dL (ref 6–20)
CO2: 25 mmol/L (ref 22–32)
Calcium: 9.9 mg/dL (ref 8.9–10.3)
Chloride: 100 mmol/L (ref 98–111)
Creatinine, Ser: 1.5 mg/dL — ABNORMAL HIGH (ref 0.61–1.24)
GFR, Estimated: 55 mL/min — ABNORMAL LOW (ref >60)
Glucose, Bld: 98 mg/dL (ref 70–99)
Potassium: 4.1 mmol/L (ref 3.5–5.1)
Sodium: 139 mmol/L (ref 135–145)

## 2024-02-05 LAB — TSH: TSH: 2.666 u[IU]/mL (ref 0.350–4.500)

## 2024-02-05 LAB — D-DIMER, QUANTITATIVE: D-Dimer, Quant: 0.76 ug{FEU}/mL — ABNORMAL HIGH (ref 0.00–0.50)

## 2024-02-05 LAB — MAGNESIUM: Magnesium: 2.2 mg/dL (ref 1.7–2.4)

## 2024-02-05 MED ORDER — SODIUM CHLORIDE 0.9 % IV BOLUS
1000.0000 mL | Freq: Once | INTRAVENOUS | Status: AC
  Administered 2024-02-05: 1000 mL via INTRAVENOUS

## 2024-02-05 MED ORDER — METOPROLOL SUCCINATE ER 25 MG PO TB24
25.0000 mg | ORAL_TABLET | Freq: Every day | ORAL | Status: DC
  Administered 2024-02-06: 25 mg via ORAL
  Filled 2024-02-05: qty 1

## 2024-02-05 MED ORDER — METOPROLOL SUCCINATE ER 25 MG PO TB24: 12.5000 mg | ORAL_TABLET | Freq: Every day | ORAL | Status: DC

## 2024-02-05 MED ORDER — ASPIRIN 81 MG PO CHEW
324.0000 mg | CHEWABLE_TABLET | Freq: Once | ORAL | Status: AC
  Administered 2024-02-05: 324 mg via ORAL
  Filled 2024-02-05: qty 4

## 2024-02-05 MED ORDER — IOHEXOL 350 MG/ML SOLN
75.0000 mL | Freq: Once | INTRAVENOUS | Status: AC | PRN
  Administered 2024-02-05: 75 mL via INTRAVENOUS

## 2024-02-05 NOTE — ED Provider Notes (Signed)
 Emergency Department Provider Note   I have reviewed the triage vital signs and the nursing notes.   HISTORY  Chief Complaint Tachycardia   HPI Ricky Bentley is a 58 y.o. male with past history reviewed below presents emergency department with acute onset heart palpitations and fatigue.  Denies any chest pain exactly but some tightness.  He has not had syncope, shortness of breath, dizziness.  No fevers or chills.  No change in medications or routine.  No history of SVT or other tachyarrhythmia in the past.  Past Medical History:  Diagnosis Date   ADD (attention deficit disorder) 11/27/2011   Cancer (HCC)    prostate   Gout    Hypertension 06/03/2019   Hypogonadism male 04/26/2013   Obesity (BMI 30.0-34.9) 06/03/2019   PE (pulmonary thromboembolism) (HCC)    PE/DVT   Pneumonia     Review of Systems  Constitutional: No fever/chills Cardiovascular: Denies chest pain; Positive palpitations.  Respiratory: Denies shortness of breath. Gastrointestinal: No abdominal pain.  No nausea, no vomiting.  Skin: Negative for rash. Neurological: Negative for headaches.  ____________________________________________   PHYSICAL EXAM:  VITAL SIGNS: ED Triage Vitals  Encounter Vitals Group     BP 02/05/24 1510 124/82     Pulse Rate 02/05/24 1456 (!) 205     Resp 02/05/24 1456 20     Temp 02/05/24 1456 (!) 97.5 F (36.4 C)     Temp src --      SpO2 02/05/24 1456 100 %   Constitutional: Alert and oriented. Well appearing and in no acute distress. Eyes: Conjunctivae are normal.  Head: Atraumatic. Nose: No congestion/rhinnorhea. Mouth/Throat: Mucous membranes are moist.   Neck: No stridor.  Cardiovascular: SVT. Good peripheral circulation. Grossly normal heart sounds.   Respiratory: Normal respiratory effort.  No retractions. Lungs CTAB. Gastrointestinal: Soft and nontender. No distention.  Musculoskeletal: No gross deformities of extremities. Neurologic:  Normal speech  and language.  Skin:  Skin is warm, dry and intact. No rash noted.   ____________________________________________   LABS (all labs ordered are listed, but only abnormal results are displayed)  Labs Reviewed  BASIC METABOLIC PANEL WITH GFR - Abnormal; Notable for the following components:      Result Value   Creatinine, Ser 1.50 (*)    GFR, Estimated 55 (*)    All other components within normal limits  D-DIMER, QUANTITATIVE - Abnormal; Notable for the following components:   D-Dimer, Quant 0.76 (*)    All other components within normal limits  COMPREHENSIVE METABOLIC PANEL WITH GFR - Abnormal; Notable for the following components:   Creatinine, Ser 1.38 (*)    Calcium 8.7 (*)    Total Protein 6.4 (*)    All other components within normal limits  TROPONIN I (HIGH SENSITIVITY) - Abnormal; Notable for the following components:   Troponin I (High Sensitivity) 337 (*)    All other components within normal limits  TROPONIN I (HIGH SENSITIVITY) - Abnormal; Notable for the following components:   Troponin I (High Sensitivity) 215 (*)    All other components within normal limits  CBC WITH DIFFERENTIAL/PLATELET  TSH  MAGNESIUM   CBC  MAGNESIUM   HIV ANTIBODY (ROUTINE TESTING W REFLEX)  TROPONIN I (HIGH SENSITIVITY)   ____________________________________________  EKG   EKG Interpretation Date/Time:  Thursday February 05 2024 14:59:13 EDT Ventricular Rate:  208 PR Interval:    QRS Duration:  88 QT Interval:  216 QTC Calculation: 402 R Axis:   41  Text Interpretation:  Supraventricular tachycardia Marked ST abnormality, possible inferior subendocardial injury Marked ST abnormality, possible anterolateral subendocardial injury Abnormal ECG When compared with ECG of 21-Jul-2023 08:46, PREVIOUS ECG IS PRESENT Confirmed by Darra Chew [phone]) on 02/05/2024 3:12:14 PM        EKG Interpretation Date/Time:  Thursday February 05 2024 15:13:27 EDT Ventricular Rate:  80 PR  Interval:  191 QRS Duration:  100 QT Interval:  377 QTC Calculation: 435 R Axis:   20  Text Interpretation: Sinus arrhythmia Repol abnrm Now in sinus rhythm Confirmed by Dreama Longs ([id number]) on 02/05/2024 7:10:49 PM        ____________________________________________  RADIOLOGY  CT Angio Chest PE W and/or Wo Contrast Result Date: 02/05/2024 CLINICAL DATA:  Palpitations.  PE suspected. EXAM: CT ANGIOGRAPHY CHEST WITH CONTRAST TECHNIQUE: Multidetector CT imaging of the chest was performed using the standard protocol during bolus administration of intravenous contrast. Multiplanar CT image reconstructions and MIPs were obtained to evaluate the vascular anatomy. RADIATION DOSE REDUCTION: This exam was performed according to the departmental dose-optimization program which includes automated exposure control, adjustment of the mA and/or kV according to patient size and/or use of iterative reconstruction technique. CONTRAST:  75mL OMNIPAQUE  IOHEXOL  350 MG/ML SOLN COMPARISON:  CTA chest 06/03/2019 FINDINGS: Cardiovascular: Negative for acute pulmonary embolism. Normal caliber thoracic aorta. No pericardial effusion. Mediastinum/Nodes: Trachea and esophagus are unremarkable. No thoracic adenopathy Lungs/Pleura: No focal consolidation, pleural effusion, or pneumothorax. Upper Abdomen: No acute abnormality.  Hepatic steatosis. Musculoskeletal: No acute fracture. Review of the MIP images confirms the above findings. IMPRESSION: Negative for acute pulmonary embolism. No acute abnormality in the chest. Hepatic steatosis. Electronically Signed   By: Norman Gatlin M.D.   On: 02/05/2024 21:05    ____________________________________________   PROCEDURES  Procedure(s) performed:   Procedures  CRITICAL CARE Performed by: Chew KANDICE Darra Total critical care time: 35 minutes Critical care time was exclusive of separately billable procedures and treating other patients. Critical care was necessary to  treat or prevent imminent or life-threatening deterioration. Critical care was time spent personally by me on the following activities: development of treatment plan with patient and/or surrogate as well as nursing, discussions with consultants, evaluation of patient's response to treatment, examination of patient, obtaining history from patient or surrogate, ordering and performing treatments and interventions, ordering and review of laboratory studies, ordering and review of radiographic studies, pulse oximetry and re-evaluation of patient's condition.  Chew Darra, MD Emergency Medicine  ____________________________________________   INITIAL IMPRESSION / ASSESSMENT AND PLAN / ED COURSE  Pertinent labs & imaging results that were available during my care of the patient were reviewed by me and considered in my medical decision making (see chart for details).   This patient is Presenting for Evaluation of tachcardia, which does require a range of treatment options, and is a complaint that involves a high risk of morbidity and mortality.  The Differential Diagnoses include SVT, AVNRT, A fib RVR, NSVT, etc.  Critical Interventions-    Medications  metoprolol  succinate (TOPROL -XL) 24 hr tablet 25 mg (has no administration in time range)  enoxaparin  (LOVENOX ) injection 40 mg (has no administration in time range)  acetaminophen  (TYLENOL ) tablet 650 mg (has no administration in time range)    Or  acetaminophen  (TYLENOL ) suppository 650 mg (has no administration in time range)  ondansetron  (ZOFRAN ) tablet 4 mg (has no administration in time range)    Or  ondansetron  (ZOFRAN ) injection 4 mg (has no administration in time range)  sodium chloride  0.9 %  bolus 1,000 mL (0 mLs Intravenous Stopped 02/05/24 1651)  iohexol  (OMNIPAQUE ) 350 MG/ML injection 75 mL (75 mLs Intravenous Contrast Given 02/05/24 2056)  aspirin  chewable tablet 324 mg (324 mg Oral Given 02/05/24 2210)    Reassessment after  intervention: symptoms improved.   Clinical Laboratory Tests Ordered, included CBC, CMP, and troponin pending.   Cardiac Monitor Tracing which shows SVT converted to NSR with vagal maneuvers.    Social Determinants of Health Risk patient is a non-smoker.   Medical Decision Making: Summary:  The patient presents to the emergency department with heart palpitations.  Arrives with heart rate greater than 200.  Rate is regular and consistent with SVT.  Immediately upon my evaluation I was able to perform vagal maneuvers with the patient and he converted to normal sinus rhythm with those maneuvers successfully.  No medications required or electric cardioversion.  His repeat EKG shows diffuse repolarization abnormality.  He is not having active chest pain now that he is not in SVT, however, given abnormal EKG do plan for troponin and further telemetry monitoring.  Reevaluation with update and discussion with patient.  Care transferred to Dr. Dreama.  Patient's presentation is most consistent with acute presentation with potential threat to life or bodily function.   Disposition: pending   ____________________________________________  FINAL CLINICAL IMPRESSION(S) / ED DIAGNOSES  Final diagnoses:  SVT (supraventricular tachycardia) (HCC)    Note:  This document was prepared using Dragon voice recognition software and may include unintentional dictation errors.  Fonda Law, MD, [facility]    Annaleise Burger, Fonda MATSU, MD 02/06/24 [phone]

## 2024-02-05 NOTE — H&P (Signed)
 History and Physical    Ricky Bentley FMW:[id number] DOB: 1965-07-04 DOA: 02/05/2024  I have briefly reviewed the patient's prior medical records in [facility]  PCP: [name], MD  Patient coming from: home  Chief Complaint: palpitations  HPI: Ricky Bentley is a 58 y.o. male with medical history significant of hypertension, obesity, prior PE no longer on anticoagulation comes into the hospital with complaints of palpitations.  He was at home, in his normal state of health, was about to go out and play pickle ball as he does almost every day, when he started experiencing palpitations, felt like his heart was racing in his chest.  He had a similar episode about a week ago but it was very short-lived.  This time, it did not resolve and decided to come to the ER.  He was found to be in SVT which broke off with Valsalva maneuver done in the ED.  He denies any recent fever or chills, he denies any caffeine intake or energy drinks, in fact has not had any caffeine in the last 2 days.  No abdominal pain, no nausea or vomiting.  Specifically denies any chest pain    ED Course: He is afebrile, heart rate initially was 200, SVT, and now returned to sinus in the low 60s.  Blood work shows a creatinine of 1.5, normal CBC.  High-sensitivity troponin went from 9 to 337.  Cardiology consulted, do not recommend heparin  as this is likely SVT induced, and we are asked to admit  Review of Systems: All systems reviewed, and apart from HPI, all negative  Past Medical History:  Diagnosis Date   ADD (attention deficit disorder) 11/27/2011   Cancer (HCC)    prostate   Gout    Hypertension 06/03/2019   Hypogonadism male 04/26/2013   Obesity (BMI 30.0-34.9) 06/03/2019   PE (pulmonary thromboembolism) (HCC)    PE/DVT   Pneumonia     Past Surgical History:  Procedure Laterality Date   ACHILLES TENDON SURGERY Bilateral    IR ANGIOGRAM PULMONARY BILATERAL SELECTIVE  06/03/2019   IR ANGIOGRAM  SELECTIVE EACH ADDITIONAL VESSEL  06/03/2019   IR ANGIOGRAM SELECTIVE EACH ADDITIONAL VESSEL  06/03/2019   IR INFUSION THROMBOL ARTERIAL INITIAL (MS)  06/03/2019   IR INFUSION THROMBOL ARTERIAL INITIAL (MS)  06/03/2019   IR THROMB F/U EVAL ART/VEN FINAL DAY (MS)  06/04/2019   IR US  GUIDE VASC ACCESS RIGHT  06/03/2019   LYMPHADENECTOMY Bilateral 07/28/2023   Procedure: BILATERAL PELVIC LYMPHADENECTOMY;  Surgeon: Renda Glance, MD;  Location: [facility];  Service: Urology;  Laterality: Bilateral;   ROBOT ASSISTED LAPAROSCOPIC RADICAL PROSTATECTOMY N/A 07/28/2023   Procedure: XI ROBOTIC ASSISTED LAPAROSCOPIC RADICAL PROSTATECTOMY LEVEL 2;  Surgeon: Renda Glance, MD;  Location: [facility];  Service: Urology;  Laterality: N/A;  210 MINUTES NEEDED FOR CASE   UMBILICAL HERNIA REPAIR N/A 07/28/2023   Procedure: HERNIA REPAIR UMBILICAL ADULT;  Surgeon: Renda Glance, MD;  Location: [facility];  Service: Urology;  Laterality: N/A;     reports that he has never smoked. He has never used smokeless tobacco. He reports current alcohol use. He reports that he does not use drugs.  Allergies  Allergen Reactions   Shellfish Allergy Other (See Comments)    Gout Flair    Family History  Problem Relation Age of Onset   Diabetes Mother    Deep vein thrombosis Brother    Diabetes Maternal Grandmother    Heart disease Maternal Grandmother    Diabetes  Maternal Grandfather    Cancer Paternal Grandmother        pancreatic cancer   Heart disease Paternal Grandfather     Prior to Admission medications   Medication Sig Start Date End Date Taking? Authorizing Provider  acetaminophen  (TYLENOL ) 500 MG tablet Take 1,000 mg by mouth every 8 (eight) hours as needed for headache or mild pain (pain score 1-3).   Yes [provider]  b complex vitamins capsule Take 1 capsule by mouth daily.   Yes [provider]  colchicine 0.6 MG tablet Take 0.6 mg by mouth daily as needed (gout pain).   Yes [provider]  losartan  (COZAAR ) 50 MG tablet Take 50 mg by mouth daily.   Yes [provider]  tadalafil (CIALIS) 5 MG tablet Take 5 mg by mouth daily.   Yes [provider]    Physical Exam: Vitals:   02/05/24 1921 02/05/24 2030 02/05/24 2045 02/05/24 2145  BP: 134/88   127/87  Pulse: 61 61 70 60  Resp: (!) 28 (!) 26 (!) 25 16  Temp: 98.7 F (37.1 C)     TempSrc: Oral     SpO2: 98% 98% 99% 98%   Constitutional: NAD, calm, comfortable Eyes: PERRL, lids and conjunctivae normal ENMT: Mucous membranes are moist. Neck: normal, supple Respiratory: clear to auscultation bilaterally, no wheezing, no crackles. Normal respiratory effort. No accessory muscle use.  Cardiovascular: Regular rate and rhythm, no murmurs / rubs / gallops. No extremity edema.  Abdomen: no tenderness, no masses palpated. Bowel sounds positive.  Musculoskeletal: no clubbing / cyanosis. Normal muscle tone.  Skin: no rashes, lesions, ulcers. No induration Neurologic: Nonfocal  Labs on Admission: I have personally reviewed following labs and imaging studies  CBC: Recent Labs  Lab 02/05/24 1510  WBC 7.7  NEUTROABS 4.2  HGB 16.4  HCT 49.3  MCV 89.3  PLT 312   Basic Metabolic Panel: Recent Labs  Lab 02/05/24 1510  NA 139  K 4.1  CL 100  CO2 25  GLUCOSE 98  BUN 18  CREATININE 1.50*  CALCIUM 9.9  MG 2.2   Liver Function Tests: No results for input(s): AST, ALT, ALKPHOS, BILITOT, PROT, ALBUMIN in the last 168 hours. Coagulation Profile: No results for input(s): INR, PROTIME in the last 168 hours. BNP (last 3 results) No results for input(s): PROBNP in the last 8760 hours. CBG: No results for input(s): GLUCAP in the last 168 hours. Thyroid Function Tests: Recent Labs    02/05/24 1510  TSH 2.666   Urine analysis:    Component Value Date/Time   COLORURINE Yellow 04/26/2013 0850   APPEARANCEUR Clear 04/26/2013 0850   LABSPEC 1.025 04/26/2013 0850    PHURINE 6.0 04/26/2013 0850   GLUCOSEU Negative 04/26/2013 0850   HGBUR Negative 04/26/2013 0850   BILIRUBINUR Negative 04/26/2013 0850   KETONESUR Negative 04/26/2013 0850   UROBILINOGEN 0.2 mg/dL 88/89/7985 9149   NITRITE Negative 04/26/2013 0850   LEUKOCYTESUR Negative 04/26/2013 0850     Radiological Exams on Admission: CT Angio Chest PE W and/or Wo Contrast Result Date: 02/05/2024 CLINICAL DATA:  Palpitations.  PE suspected. EXAM: CT ANGIOGRAPHY CHEST WITH CONTRAST TECHNIQUE: Multidetector CT imaging of the chest was performed using the standard protocol during bolus administration of intravenous contrast. Multiplanar CT image reconstructions and MIPs were obtained to evaluate the vascular anatomy. RADIATION DOSE REDUCTION: This exam was performed according to the departmental dose-optimization program which includes automated exposure control, adjustment of the mA and/or kV  according to patient size and/or use of iterative reconstruction technique. CONTRAST:  75mL OMNIPAQUE  IOHEXOL  350 MG/ML SOLN COMPARISON:  CTA chest 06/03/2019 FINDINGS: Cardiovascular: Negative for acute pulmonary embolism. Normal caliber thoracic aorta. No pericardial effusion. Mediastinum/Nodes: Trachea and esophagus are unremarkable. No thoracic adenopathy Lungs/Pleura: No focal consolidation, pleural effusion, or pneumothorax. Upper Abdomen: No acute abnormality.  Hepatic steatosis. Musculoskeletal: No acute fracture. Review of the MIP images confirms the above findings. IMPRESSION: Negative for acute pulmonary embolism. No acute abnormality in the chest. Hepatic steatosis. Electronically Signed   By: Norman Gatlin M.D.   On: 02/05/2024 21:05    EKG: Independently reviewed.  Sinus rhythm  Assessment/Plan Principal problem SVT-discussed with cardiology, start metoprolol  at a lower dose given low resting heart rate in the low 60s. - Monitor on telemetry, appreciate cardiology consultation.  TSH is normal - Obtain  2D echocardiogram  Active problems Essential hypertension-hold home agents as starting metoprolol   Obesity, class II-calculated BMI 37.7.  She would benefit from weight loss  Prior PE-not on anticoagulation, CT angiogram on admission negative for PE  CKD 3A-creatinine ranging between 1.2 and 1.5 as far back as 2020.  Currently at baseline  Elevated troponin -due to SVT, already trending down.  He had no chest pain   DVT prophylaxis: Lovenox   Code Status: Full code  Family Communication: No family at bedside Disposition Plan: Home tomorrow once cleared by cardiology Bed Type: Cardiac telemetry Consults called: Cardiology Obs/Inp: Observation  At the time of admission, it appears that the appropriate admission status for this patient is INPATIENT as it is expected that patient will require hospital care > 2 midnights. This is judged to be reasonable and necessary in order to provide the required intensity of service to ensure the patient's safety given: presenting symptoms, initial radiographic and laboratory data and in the context of their chronic comorbidities. Together, these circumstances are felt to place patient at high at high risk for further clinical deterioration threatening life, limb, or organ.  Nilda Fendt, MD, PhD [facility]  Contact via [email or web address]  02/05/2024, 10:08 PM

## 2024-02-05 NOTE — ED Notes (Signed)
 Awaiting patient from lobby.

## 2024-02-05 NOTE — ED Notes (Addendum)
 Pt in room, on Zoll monitor. Sharyne MD and Long MD at bedside. Vagal maneuver completed, HR to 80's

## 2024-02-05 NOTE — ED Triage Notes (Signed)
 Patient c/o palpitations starting about an hour ago. Denies shob, lightheadedness, and dizziness.

## 2024-02-05 NOTE — ED Provider Notes (Signed)
  Physical Exam  BP (!) 128/91   Pulse 79   Temp (!) 97.5 F (36.4 C)   Resp (!) 27   SpO2 97%   Physical Exam  Procedures  Procedures  ED Course / MDM    Medical Decision Making Amount and/or Complexity of Data Reviewed Labs: ordered. Radiology: ordered.  Risk OTC drugs. Prescription drug management. Decision regarding hospitalization.   58yo male with hx of prostate cancer/surgery in February, hx of saddle PE in 2020 with family history of DVT/PE recommended life long anticoagulation however no longer on anticoagulation presents with uncomfortable sensation in chest, SVT that responded to modified valsalva.  EKG with abnormalities on repeat after conversion to sinus rhythm and troponin has been sent.  Give his PE history, prostate ancer hx, tested ddimer. Ddimer positivie and CTA PE study completed and negative.  Delta troponin increased form 9 to  337.  May be in setting of SVT but with significant increaseand some ECG abnormalities do have concern for underlying heart disease.  Discussed wth Dr. Cesario Cardiac fellow-will admit to hospitalist service for continued observation.Given aspirin . WIll hold on heparin  at this time in setting of SVT>        Dreama Longs, MD 02/06/24 [id number]

## 2024-02-05 NOTE — Consult Note (Signed)
 Cardiology Consultation   Patient ID: Ricky Bentley MRN: [id number]; DOB: 1966-05-21  Admit date: 02/05/2024 Date of Consult: 02/05/2024  PCP:  [name], MD   [facility] Providers Cardiologist:  None        Patient Profile: Ricky Bentley is a 58 y.o. male with a hx of prostate cancer who is being seen 02/05/2024 for the evaluation of palpitations and fatigue at the request of ED.  History of Present Illness: Mr. Ricky Bentley presented to the ER tonight with onset of palpitations at around 1:30 today as he was leaving the barber shop associated with fatigue and pressure in chest. No SOB.   In ED, SVT to heart rate of 210, troponin initially 9 and uptrended to 227. D-dimer positive, but chest CTA without PE. TSH normal.  SVT terminated with what sounds like vagal maneuvers and has not recurred since.  No prior cardiac history, echo in 2021 without structural abnormalities.  HTN on losartan .  Prior bilateral PE 2020 in the setting of exogenous testosterone  treatment for hypogonadism treated with catheter guided thrombolysis and prolonged apixaban  course, currently not anticoagulated. Prostate cancer s/p prostatectomy and he reports being on a PDE agent   Past Medical History:  Diagnosis Date   ADD (attention deficit disorder) 11/27/2011   Cancer (HCC)    prostate   Gout    Hypertension 06/03/2019   Hypogonadism male 04/26/2013   Obesity (BMI 30.0-34.9) 06/03/2019   PE (pulmonary thromboembolism) (HCC)    PE/DVT   Pneumonia     Past Surgical History:  Procedure Laterality Date   ACHILLES TENDON SURGERY Bilateral    IR ANGIOGRAM PULMONARY BILATERAL SELECTIVE  06/03/2019   IR ANGIOGRAM SELECTIVE EACH ADDITIONAL VESSEL  06/03/2019   IR ANGIOGRAM SELECTIVE EACH ADDITIONAL VESSEL  06/03/2019   IR INFUSION THROMBOL ARTERIAL INITIAL (MS)  06/03/2019   IR INFUSION THROMBOL ARTERIAL INITIAL (MS)  06/03/2019   IR THROMB F/U EVAL ART/VEN FINAL DAY (MS)  06/04/2019    IR US  GUIDE VASC ACCESS RIGHT  06/03/2019   LYMPHADENECTOMY Bilateral 07/28/2023   Procedure: BILATERAL PELVIC LYMPHADENECTOMY;  Surgeon: Renda Glance, MD;  Location: [facility];  Service: Urology;  Laterality: Bilateral;   ROBOT ASSISTED LAPAROSCOPIC RADICAL PROSTATECTOMY N/A 07/28/2023   Procedure: XI ROBOTIC ASSISTED LAPAROSCOPIC RADICAL PROSTATECTOMY LEVEL 2;  Surgeon: Renda Glance, MD;  Location: [facility];  Service: Urology;  Laterality: N/A;  210 MINUTES NEEDED FOR CASE   UMBILICAL HERNIA REPAIR N/A 07/28/2023   Procedure: HERNIA REPAIR UMBILICAL ADULT;  Surgeon: Renda Glance, MD;  Location: [facility];  Service: Urology;  Laterality: N/A;       Scheduled Meds:  Continuous Infusions:  PRN Meds:   Allergies:    Allergies  Allergen Reactions   Shellfish Allergy Other (See Comments)    Gout Flair    Social History:   Social History   Socioeconomic History   Marital status: Married    Spouse name: Not on file   Number of children: Not on file   Years of education: Not on file   Highest education level: Not on file  Occupational History   Not on file  Tobacco Use   Smoking status: Never   Smokeless tobacco: Never  Vaping Use   Vaping status: Never Used  Substance and Sexual Activity   Alcohol use: Yes    Comment: occasional   Drug use: No   Sexual activity: Yes  Other Topics Concern   Not on file  Social History Narrative  Not on file   Social Drivers of Health   Financial Resource Strain: Low Risk  (03/31/2023)   Received from [facility]   Overall Financial Resource Strain (CARDIA)    Difficulty of Paying Living Expenses: Not hard at all  Food Insecurity: No Food Insecurity (07/28/2023)   Hunger Vital Sign    Worried About Running Out of Food in the Last Year: Never true    Ran Out of Food in the Last Year: Never true  Transportation Needs: No Transportation Needs (07/28/2023)   PRAPARE - Administrator, Civil Service (Medical): No    Lack of  Transportation (Non-Medical): No  Physical Activity: Sufficiently Active (03/31/2023)   Received from [facility]   Exercise Vital Sign    On average, how many days per week do you engage in moderate to strenuous exercise (like a brisk walk)?: 4 days    On average, how many minutes do you engage in exercise at this level?: 120 min  Stress: No Stress Concern Present (03/31/2023)   Received from [facility] - Occupational Stress Questionnaire    Feeling of Stress : Not at all  Social Connections: Moderately Isolated (07/28/2023)   Social Connection and Isolation Panel    Frequency of Communication with Friends and Family: Never    Frequency of Social Gatherings with Friends and Family: Never    Attends Religious Services: Never    Database administrator or Organizations: Yes    Attends Engineer, structural: 1 to 4 times per year    Marital Status: Married  Catering manager Violence: Not At Risk (07/28/2023)   Humiliation, Afraid, Rape, and Kick questionnaire    Fear of Current or Ex-Partner: No    Emotionally Abused: No    Physically Abused: No    Sexually Abused: No    Family History:    Family History  Problem Relation Age of Onset   Diabetes Mother    Deep vein thrombosis Brother    Diabetes Maternal Grandmother    Heart disease Maternal Grandmother    Diabetes Maternal Grandfather    Cancer Paternal Grandmother        pancreatic cancer   Heart disease Paternal Grandfather      ROS:  Please see the history of present illness.   All other ROS reviewed and negative.     Physical Exam/Data: Vitals:   02/05/24 1900 02/05/24 1921 02/05/24 2030 02/05/24 2045  BP: 111/79 134/88    Pulse: (!) 58 61 61 70  Resp: 15 (!) 28 (!) 26 (!) 25  Temp:  98.7 F (37.1 C)    TempSrc:  Oral    SpO2: 100% 98% 98% 99%   No intake or output data in the 24 hours ending 02/05/24 2125    07/28/2023    5:42 AM 07/21/2023    8:21 AM  02/01/2020    9:01 AM  Last 3 Weights  Weight (lbs) 261 lb 261 lb 247 lb 1.6 oz  Weight (kg) 118.389 kg 118.389 kg 112.084 kg     There is no height or weight on file to calculate BMI.  General:  Well nourished, well developed, in no acute distress HEENT: normal Neck: no JVD Vascular: No carotid bruits; Distal pulses 2+ bilaterally Cardiac:  normal S1, S2; RRR; no murmur  Lungs:  clear to auscultation bilaterally, no wheezing, rhonchi or rales  Abd: soft, nontender, no hepatomegaly  Ext: no edema Musculoskeletal:  No deformities, BUE and BLE strength normal and equal Skin: warm and dry    Laboratory Data: High Sensitivity Troponin:   Recent Labs  Lab 02/05/24 1510 02/05/24 1916  TROPONINIHS 9 337*     Chemistry Recent Labs  Lab 02/05/24 1510  NA 139  K 4.1  CL 100  CO2 25  GLUCOSE 98  BUN 18  CREATININE 1.50*  CALCIUM 9.9  MG 2.2  GFRNONAA 55*  ANIONGAP 14    Hematology Recent Labs  Lab 02/05/24 1510  WBC 7.7  RBC 5.52  HGB 16.4  HCT 49.3  MCV 89.3  MCH 29.7  MCHC 33.3  RDW 13.3  PLT 312   Thyroid  Recent Labs  Lab 02/05/24 1510  TSH 2.666    BNPNo results for input(s): BNP, PROBNP in the last 168 hours.  DDimer  Recent Labs  Lab 02/05/24 1916  DDIMER 0.76*    Radiology/Studies:  CT Angio Chest PE W and/or Wo Contrast Result Date: 02/05/2024 CLINICAL DATA:  Palpitations.  PE suspected. EXAM: CT ANGIOGRAPHY CHEST WITH CONTRAST TECHNIQUE: Multidetector CT imaging of the chest was performed using the standard protocol during bolus administration of intravenous contrast. Multiplanar CT image reconstructions and MIPs were obtained to evaluate the vascular anatomy. RADIATION DOSE REDUCTION: This exam was performed according to the departmental dose-optimization program which includes automated exposure control, adjustment of the mA and/or kV according to patient size and/or use of iterative reconstruction technique. CONTRAST:  75mL OMNIPAQUE   IOHEXOL  350 MG/ML SOLN COMPARISON:  CTA chest 06/03/2019 FINDINGS: Cardiovascular: Negative for acute pulmonary embolism. Normal caliber thoracic aorta. No pericardial effusion. Mediastinum/Nodes: Trachea and esophagus are unremarkable. No thoracic adenopathy Lungs/Pleura: No focal consolidation, pleural effusion, or pneumothorax. Upper Abdomen: No acute abnormality.  Hepatic steatosis. Musculoskeletal: No acute fracture. Review of the MIP images confirms the above findings. IMPRESSION: Negative for acute pulmonary embolism. No acute abnormality in the chest. Hepatic steatosis. Electronically Signed   By: Norman Gatlin M.D.   On: 02/05/2024 21:05     Assessment and Plan: Troponin elevation likely type II MI secondary to tachycardia from SVT for a few hours. First occurrence of arrhythmia, currently heart rates in the 60s. - no indication for anticoagulation currently, nor ASA - continue to trend troponin, if it continues to rise, then suggest an echo to look for LV function decrease. - start low dose B-blockade: suggest metoprolol  succinate 25 mg daily. - overnight observation.  If stable, can discharge with outpatient EP follow-up.   Risk Assessment/Risk Scores:              For questions or updates, please contact [facility] Please consult [email or web address] for contact info under    Signed, Andee Flatten, MD  02/05/2024 9:25 PM

## 2024-02-05 NOTE — ED Notes (Signed)
 CCMD called by this RN

## 2024-02-06 ENCOUNTER — Other Ambulatory Visit: Payer: Self-pay

## 2024-02-06 ENCOUNTER — Observation Stay (HOSPITAL_BASED_OUTPATIENT_CLINIC_OR_DEPARTMENT_OTHER)

## 2024-02-06 ENCOUNTER — Other Ambulatory Visit (HOSPITAL_COMMUNITY): Payer: Self-pay

## 2024-02-06 DIAGNOSIS — I471 Supraventricular tachycardia, unspecified: Secondary | ICD-10-CM | POA: Diagnosis not present

## 2024-02-06 DIAGNOSIS — I10 Essential (primary) hypertension: Secondary | ICD-10-CM | POA: Diagnosis not present

## 2024-02-06 DIAGNOSIS — R9431 Abnormal electrocardiogram [ECG] [EKG]: Secondary | ICD-10-CM | POA: Diagnosis not present

## 2024-02-06 LAB — CBC
HCT: 41.5 % (ref 39.0–52.0)
Hemoglobin: 14.2 g/dL (ref 13.0–17.0)
MCH: 30.3 pg (ref 26.0–34.0)
MCHC: 34.2 g/dL (ref 30.0–36.0)
MCV: 88.5 fL (ref 80.0–100.0)
Platelets: 260 K/uL (ref 150–400)
RBC: 4.69 MIL/uL (ref 4.22–5.81)
RDW: 13.2 % (ref 11.5–15.5)
WBC: 6.4 K/uL (ref 4.0–10.5)
nRBC: 0 % (ref 0.0–0.2)

## 2024-02-06 LAB — ECHOCARDIOGRAM COMPLETE
Area-P 1/2: 4.39 cm2
Height: 70 in
S' Lateral: 3.2 cm
Weight: 4236.36 [oz_av]

## 2024-02-06 LAB — COMPREHENSIVE METABOLIC PANEL WITH GFR
ALT: 23 U/L (ref 0–44)
AST: 19 U/L (ref 15–41)
Albumin: 3.6 g/dL (ref 3.5–5.0)
Alkaline Phosphatase: 44 U/L (ref 38–126)
Anion gap: 6 (ref 5–15)
BUN: 17 mg/dL (ref 6–20)
CO2: 25 mmol/L (ref 22–32)
Calcium: 8.7 mg/dL — ABNORMAL LOW (ref 8.9–10.3)
Chloride: 108 mmol/L (ref 98–111)
Creatinine, Ser: 1.38 mg/dL — ABNORMAL HIGH (ref 0.61–1.24)
GFR, Estimated: >60 mL/min (ref >60)
Glucose, Bld: 99 mg/dL (ref 70–99)
Potassium: 3.7 mmol/L (ref 3.5–5.1)
Sodium: 139 mmol/L (ref 135–145)
Total Bilirubin: 0.8 mg/dL (ref 0.0–1.2)
Total Protein: 6.4 g/dL — ABNORMAL LOW (ref 6.5–8.1)

## 2024-02-06 LAB — MAGNESIUM: Magnesium: 2.3 mg/dL (ref 1.7–2.4)

## 2024-02-06 LAB — HIV ANTIBODY (ROUTINE TESTING W REFLEX): HIV Screen 4th Generation wRfx: NONREACTIVE

## 2024-02-06 MED ORDER — ONDANSETRON HCL 4 MG/2ML IJ SOLN: 4.0000 mg | Freq: Four times a day (QID) | INTRAMUSCULAR | Status: DC | PRN

## 2024-02-06 MED ORDER — ACETAMINOPHEN 325 MG PO TABS: 650.0000 mg | ORAL_TABLET | Freq: Four times a day (QID) | ORAL | Status: DC | PRN

## 2024-02-06 MED ORDER — ENOXAPARIN SODIUM 40 MG/0.4ML IJ SOSY
40.0000 mg | PREFILLED_SYRINGE | INTRAMUSCULAR | Status: DC
  Administered 2024-02-06: 40 mg via SUBCUTANEOUS
  Filled 2024-02-06: qty 0.4

## 2024-02-06 MED ORDER — METOPROLOL SUCCINATE ER 25 MG PO TB24
25.0000 mg | ORAL_TABLET | Freq: Every day | ORAL | 0 refills | Status: DC
  Filled 2024-02-06: qty 30, 30d supply, fill #0

## 2024-02-06 MED ORDER — ONDANSETRON HCL 4 MG PO TABS
4.0000 mg | ORAL_TABLET | Freq: Four times a day (QID) | ORAL | Status: DC | PRN
Start: 2024-02-06 — End: 2024-02-06

## 2024-02-06 MED ORDER — ACETAMINOPHEN 650 MG RE SUPP: 650.0000 mg | Freq: Four times a day (QID) | RECTAL | Status: DC | PRN

## 2024-02-06 NOTE — Progress Notes (Addendum)
 History and all data above reviewed.  Patient examined.  I agree with the findings as above.  The patient exam reveals:  VS:  BP 131/89 (BP Location: Left Arm)   Pulse 60   Temp 98.3 F (36.8 C) (Oral)   Resp 18   Ht 5' 10 (1.778 m)   Wt 120.1 kg   SpO2 99%   BMI 37.99 kg/m  , BMI Body mass index is 37.99 kg/m. GENERAL:  Well appearing HEENT: Pupils equal round and reactive, fundi not visualized, oral mucosa unremarkable NECK:  No jugular venous distention, waveform within normal limits, carotid upstroke brisk and symmetric, no bruits, no thyromegaly LUNGS:  Clear to auscultation bilaterally HEART:  RRR.  PMI not displaced or sustained,S1 and S2 within normal limits, no S3, no S4, no clicks, no rubs, no murmurs ABD:  Flat, positive bowel sounds normal in frequency in pitch, no bruits, no rebound, no guarding, no midline pulsatile mass, no hepatomegaly, no splenomegaly EXT:  2 plus pulses throughout, no edema, no cyanosis no clubbing SKIN:  No rashes no nodules NEURO:  Cranial nerves II through XII grossly intact, motor grossly intact throughout PSYCH:  Cognitively intact, oriented to person place and time   All available labs, radiology testing, previous records reviewed. Agree with documented assessment and plan. Mr. Sambrano is a 4M with hypertension admitted with SVT.  # SVT:  He reports that his first time having palpitations was a couple weeks ago.  This has not been a longstanding issue.  No significant electrolyte abnormalities.  He does note that he has not been drinking very well lately.  However he would did not feel overtly dehydrated.  Thyroid function was normal.  I personally reviewed his echocardiogram and systolic function is normal.  No significant valvular abnormalities.  It is unclear what is causing this.  He denies any recent illness or infections.  He exercises regularly and has no exertional symptoms.  No heart failure symptoms.  Agree with starting the metoprolol .   We did discuss other treatment options such as ablation.  However given this is not a longstanding issue recommend monitoring for now.  He notes that he does snore.  His wife denies apnea and he has no other symptoms suggestive of underlying OSA.  # Hypertension: Home losartan  on hold.  Starting metoprolol  as above.  Asked him to check his blood pressure and heart rates twice a day and bring them to follow-up.  # Elevated troponin:  Findings are consistent with demand ischemia.  He plays pickle ball every day and has had no exertional symptoms.  I also reviewed the chest CT and there was no evidence of any coronary artery calcification.  Unlikely that this is an obstructive coronary issue.  I suspect this is all related to his tachycardia with rates in the 200s.  Systolic function is normal on echo.     [facility] will sign off.   Medication Recommendations:  continue metoprolol .  Stop losartan . Other recommendations (labs, testing, etc):  n/a Follow up as an outpatient:  we will arrange   Ariyanna Oien C. Raford, MD, [facility]  02/06/2024 12:24 PM

## 2024-02-06 NOTE — Progress Notes (Signed)
  Echocardiogram 2D Echocardiogram has been performed.  Koleen KANDICE Popper, RDCS 02/06/2024, 10:21 AM

## 2024-02-06 NOTE — Discharge Summary (Signed)
 Physician Discharge Summary   Patient: Ricky Bentley MRN: [id number] DOB: Jan 28, 1966  Admit date:     02/05/2024  Discharge date: 02/06/24  Discharge Physician: [name]   PCP: [name], MD     Recommendations at discharge:  Follow up with Cardiology for new SVT in 3 weeks     Discharge Diagnoses: Principal Problem:   SVT (supraventricular tachycardia) (HCC) Other hospital problems   Hypertension   Class 2 obesity   Prostate Cancer   Hx VTE   Demand ischemia   Hospital Course: 58 y.o. M with HTN, hx saddle PE, hx prosCA s/p prostatectomy presented with heart racing.  Found in the ER to have SVT, converted with vagal maneuver.    SVT Admitted and ECG showed SVT.  Cardiology were consulted, they recommended echocardiogram and starting metoprolol .  He was monitored overnight, no further SVTs.  Echocardiogram was reviewed by cardiology and reportedly unremarkable, cleared for discharge home with cardiology follow-up in 3 weeks.  Hypertension Losartan  was changed to metoprolol   History of saddle PE No longer on anticoagulation.  CTA chest obtained here and unremarkable.  History of prostate cancer status post prostatectomy            The [place]  Controlled Substances Registry was reviewed for this patient prior to discharge.  Consultants: Cardiology, Dr. Raford Procedures performed:   Disposition: Home Diet recommendation:  Discharge Diet Orders (From admission, onward)     Start     Ordered   02/06/24 0000  Diet - low sodium heart healthy        02/06/24 1353             DISCHARGE MEDICATION: Allergies as of 02/06/2024       Reactions   Shellfish Allergy Other (See Comments)   Gout Flair        Medication List     STOP taking these medications    losartan  50 MG tablet Commonly known as: COZAAR        TAKE these medications    acetaminophen  500 MG tablet Commonly known as: TYLENOL  Take 1,000 mg  by mouth every 8 (eight) hours as needed for headache or mild pain (pain score 1-3).   b complex vitamins capsule Take 1 capsule by mouth daily.   colchicine 0.6 MG tablet Take 0.6 mg by mouth daily as needed (gout pain).   metoprolol  succinate 25 MG 24 hr tablet Commonly known as: TOPROL -XL Take 1 tablet (25 mg total) by mouth daily. Start taking on: February 07, 2024   tadalafil 5 MG tablet Commonly known as: CIALIS Take 5 mg by mouth daily.        Follow-up Information     Swinyer, Ricky HERO, NP. Go to.   Specialty: Cardiology Contact information: [address] [place] [address] [phone]                 Discharge Instructions     Diet - low sodium heart healthy   Complete by: As directed    Discharge instructions   Complete by: As directed    **IMPORTANT DISCHARGE INSTRUCTIONS**   From Dr. Dalton: You were admitted for a new SVT (Supraventricular tachycardia)  This is a type of abnormal heart rhythm that occurs spontaneously or when the heart is under stress, usually for brief periods of time.  It may happen again If it does, repeat the vagal maneuvers you were taught (bearing down like you're holding your breath is a common one)  If this doesn't stop it, you may call your doctor or proceed to the ER depending on how you feel  SWITCH your blood pressure medicine from losartan  to metoprolol  Metoprolol  can suppress these abnormal rhythms  Go see the Cardiologist Ricky Bentley at the time detailed below.  Let her know how the metoprolol  makes you feel, if you can tell a difference.   Increase activity slowly   Complete by: As directed        Discharge Exam: Filed Weights   02/05/24 2357  Weight: 120.1 kg    General: Pt is alert, awake, not in acute distress Cardiovascular: RRR, nl S1-S2, no murmurs appreciated.   No LE edema.   Respiratory: Normal respiratory rate and rhythm.  CTAB without rales or wheezes. Abdominal:  Abdomen soft and non-tender.  No distension or HSM.   Neuro/Psych: Strength symmetric in upper and lower extremities.  Judgment and insight appear normal.   Condition at discharge: good  The results of significant diagnostics from this hospitalization (including imaging, microbiology, ancillary and laboratory) are listed below for reference.   Imaging Studies: CT Angio Chest PE W and/or Wo Contrast Result Date: 02/05/2024 CLINICAL DATA:  Palpitations.  PE suspected. EXAM: CT ANGIOGRAPHY CHEST WITH CONTRAST TECHNIQUE: Multidetector CT imaging of the chest was performed using the standard protocol during bolus administration of intravenous contrast. Multiplanar CT image reconstructions and MIPs were obtained to evaluate the vascular anatomy. RADIATION DOSE REDUCTION: This exam was performed according to the departmental dose-optimization program which includes automated exposure control, adjustment of the mA and/or kV according to patient size and/or use of iterative reconstruction technique. CONTRAST:  75mL OMNIPAQUE  IOHEXOL  350 MG/ML SOLN COMPARISON:  CTA chest 06/03/2019 FINDINGS: Cardiovascular: Negative for acute pulmonary embolism. Normal caliber thoracic aorta. No pericardial effusion. Mediastinum/Nodes: Trachea and esophagus are unremarkable. No thoracic adenopathy Lungs/Pleura: No focal consolidation, pleural effusion, or pneumothorax. Upper Abdomen: No acute abnormality.  Hepatic steatosis. Musculoskeletal: No acute fracture. Review of the MIP images confirms the above findings. IMPRESSION: Negative for acute pulmonary embolism. No acute abnormality in the chest. Hepatic steatosis. Electronically Signed   By: Ricky Bentley M.D.   On: 02/05/2024 21:05    Microbiology: Results for orders placed or performed during the hospital encounter of 06/03/19  SARS CORONAVIRUS 2 (TAT 6-24 HRS) Nasopharyngeal Nasopharyngeal Swab     Status: None   Collection Time: 06/03/19 12:15 PM   Specimen:  Nasopharyngeal Swab  Result Value Ref Range Status   SARS Coronavirus 2 NEGATIVE NEGATIVE Final    Comment: (NOTE) SARS-CoV-2 target nucleic acids are NOT DETECTED. The SARS-CoV-2 RNA is generally detectable in upper and lower respiratory specimens during the acute phase of infection. Negative results do not preclude SARS-CoV-2 infection, do not rule out co-infections with other pathogens, and should not be used as the sole basis for treatment or other patient management decisions. Negative results must be combined with clinical observations, patient history, and epidemiological information. The expected result is Negative. Fact Sheet for Patients: HairSlick.no Fact Sheet for Healthcare Providers: quierodirigir.com This test is not yet approved or cleared by the [place]  FDA and  has been authorized for detection and/or diagnosis of SARS-CoV-2 by FDA under an Emergency Use Authorization (EUA). This EUA will remain  in effect (meaning this test can be used) for the duration of the COVID-19 declaration under Section 56 4(b)(1) of the Act, 21 U.S.C. section 360bbb-3(b)(1), unless the authorization is terminated or revoked sooner. Performed at [facility], [address], [place], [place] [address]   Culture, blood (routine x 2)     Status: None   Collection Time: 06/03/19 12:37 PM   Specimen: BLOOD  Result Value Ref Range Status   Specimen Description   Final    BLOOD BLOOD RIGHT FOREARM Performed at [facility], [address], [place], [place] [address]    Special Requests   Final    BOTTLES DRAWN AEROBIC AND ANAEROBIC Blood Culture adequate volume Performed at [facility], [address] [place], [place] [address]    Culture   Final    NO GROWTH 5 DAYS Performed at [facility], [address], [place], [place] [address]    Report Status 06/08/2019 FINAL  Final   Culture, blood (routine x 2)     Status: None   Collection Time: 06/03/19 12:37 PM   Specimen: BLOOD  Result Value Ref Range Status   Specimen Description   Final    BLOOD RIGHT ANTECUBITAL Performed at [facility], [address] [place], [place] [address]    Special Requests   Final    BOTTLES DRAWN AEROBIC AND ANAEROBIC Blood Culture results may not be optimal due to an excessive volume of blood received in culture bottles Performed at [facility], [address] [place], [place] [address]    Culture   Final    NO GROWTH 5 DAYS Performed at [facility], [address], [place], [place] [address]    Report Status 06/08/2019 FINAL  Final  Culture, sputum-assessment     Status: None   Collection Time: 06/03/19  2:04 PM   Specimen: Expectorated Sputum  Result Value Ref Range Status   Specimen Description EXPECTORATED SPUTUM  Final   Special Requests NONE  Final   Sputum evaluation   Final    THIS SPECIMEN IS ACCEPTABLE FOR SPUTUM CULTURE Performed at [facility], [address], [place], [place] [address]    Report Status 06/03/2019 FINAL  Final  Culture, respiratory     Status: None   Collection Time: 06/03/19  2:04 PM  Result Value Ref Range Status   Specimen Description   Final    EXPECTORATED SPUTUM Performed at [facility], [address], [place], [place] [address]    Special Requests   Final    NONE Reflexed from [id number] Performed at [facility], [address] [place], [place] [address]    Gram Stain   Final    MODERATE WBC PRESENT,BOTH PMN AND MONONUCLEAR FEW GRAM NEGATIVE RODS FEW GRAM NEGATIVE COCCOBACILLI    Culture   Final    Consistent with normal respiratory flora. Performed at [facility], [address] [place], [place] [address]    Report Status 06/06/2019 FINAL  Final  MRSA PCR Screening     Status: None   Collection Time: 06/03/19   4:13 PM   Specimen: Nasal Mucosa; Nasopharyngeal  Result Value Ref Range Status   MRSA by PCR NEGATIVE NEGATIVE Final    Comment:        The GeneXpert MRSA Assay (FDA approved for NASAL specimens only), is one component of a comprehensive MRSA colonization surveillance program. It is not intended to diagnose MRSA infection nor to guide or monitor treatment for MRSA infections. Performed at [facility], [address], [place], [place] [address]     Labs: CBC: Recent Labs  Lab  02/05/24 1510 02/06/24 0502  WBC 7.7 6.4  NEUTROABS 4.2  --   HGB 16.4 14.2  HCT 49.3 41.5  MCV 89.3 88.5  PLT 312 260   Basic Metabolic Panel: Recent Labs  Lab 02/05/24 1510 02/06/24 0502  NA 139 139  K 4.1 3.7  CL 100 108  CO2 25 25  GLUCOSE 98 99  BUN 18 17  CREATININE 1.50* 1.38*  CALCIUM 9.9 8.7*  MG 2.2 2.3   Liver Function Tests: Recent Labs  Lab 02/06/24 0502  AST 19  ALT 23  ALKPHOS 44  BILITOT 0.8  PROT 6.4*  ALBUMIN 3.6   CBG: No results for input(s): GLUCAP in the last 168 hours.  Discharge time spent: approximately 25 minutes spent on discharge counseling, evaluation of patient on day of discharge, and coordination of discharge planning with nursing, social work, pharmacy and case management  Signed: [name], MD [facility] 02/06/2024

## 2024-02-06 NOTE — Progress Notes (Signed)
  Progress Note  Patient Name: Ricky Bentley Date of Encounter: 02/06/2024 [facility] Cardiologist: New  Interval Summary   Feeling good this morning Remains in sinus, HR 50-60s  Denies any lightheadedness, dizziness, presyncope  Ambulating around his room without difficulty   Vital Signs Vitals:   02/05/24 2245 02/05/24 2318 02/05/24 2357 02/06/24 0453  BP: (!) 151/94  117/72 124/82  Pulse: (!) 56  (!) 55 (!) 49  Resp: (!) 27  20 20   Temp:  97.6 F (36.4 C) 97.9 F (36.6 C) 97.7 F (36.5 C)  TempSrc:  Oral Oral Oral  SpO2: 97%  99%   Weight:   120.1 kg   Height:   5' 10 (1.778 m)    No intake or output data in the 24 hours ending 02/06/24 0837    02/05/2024   11:57 PM 07/28/2023    5:42 AM 07/21/2023    8:21 AM  Last 3 Weights  Weight (lbs) 264 lb 12.4 oz 261 lb 261 lb  Weight (kg) 120.1 kg 118.389 kg 118.389 kg     Telemetry/ECG  Sinus rhythm, HR 50-60s - Personally Reviewed  Physical Exam  GEN: No acute distress.   Neck: No JVD Cardiac: bradycardic, no murmurs, rubs, or gallops.  Respiratory: Clear to auscultation bilaterally. GI: Soft, nontender, non-distended  MS: No edema  Assessment & Plan   Supraventricular tachycardia Presented with palpitations  EKG showed SVT with HR 208 SVT broke with Valsalva in ER Electrolytes normal TSH normal Currently in sinus with HR 50s  Patient denies any lightheadedness, dizziness, presyncope, syncope Currently on Toprol  25 mg daily, rates have been sustaining in 50-60s Pending echocardiogram, per primary -- suspect that if normal can discharge home with outpatient follow up Educated patient on vagal maneuvers    Elevated troponin  9 ? 337 ? 215 No active chest pain Suspect in setting of demand ischemia, with SVT for few hours   Hypertension Home meds: losartan  50 mg daily ARB held in setting of starting Toprol   Most recent BP 124/82   For questions or updates, please contact [facility] Please consult [email or web address] for contact info under       Signed, Waddell DELENA Donath, PA-C

## 2024-03-03 ENCOUNTER — Ambulatory Visit (HOSPITAL_BASED_OUTPATIENT_CLINIC_OR_DEPARTMENT_OTHER): Admitting: Nurse Practitioner

## 2024-03-03 ENCOUNTER — Encounter (HOSPITAL_BASED_OUTPATIENT_CLINIC_OR_DEPARTMENT_OTHER): Payer: Self-pay | Admitting: Nurse Practitioner

## 2024-03-03 VITALS — BP 134/86 | HR 54 | Ht 70.0 in | Wt 259.6 lb

## 2024-03-03 DIAGNOSIS — I10 Essential (primary) hypertension: Secondary | ICD-10-CM | POA: Diagnosis not present

## 2024-03-03 DIAGNOSIS — I471 Supraventricular tachycardia, unspecified: Secondary | ICD-10-CM | POA: Diagnosis not present

## 2024-03-03 DIAGNOSIS — Z7189 Other specified counseling: Secondary | ICD-10-CM

## 2024-03-03 DIAGNOSIS — Z13220 Encounter for screening for lipoid disorders: Secondary | ICD-10-CM

## 2024-03-03 MED ORDER — METOPROLOL SUCCINATE ER 25 MG PO TB24: 25.0000 mg | ORAL_TABLET | Freq: Every day | ORAL | 3 refills | Status: AC

## 2024-03-03 NOTE — Progress Notes (Signed)
 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  In the ED he was found to be in SVT with heart rate of 210, troponin elevated initially 9 but up trended to 227.  He converted with vagal maneuver.  D-dimer positive, chest CTA without PE.  TSH normal.  No prior cardiac history.  Echo 2021 without structural abnormalities.  History of hypertension treated with losartan .  Prior bilateral PE 2020 in the setting of exogenous testosterone  treatment for hypogonadism treated with catheter guided thrombolysis and prolonged apixaban  course, currently not on anticoagulation.  He first noted palpitations a few weeks prior.  He did not feel particularly dehydrated, but admitted to poor hydration.  He exercises regularly, playing pickle ball almost daily. TTE 02/06/24 revealed LVEF 60 to 65%, no RWMA, normal diastolic parameters, normal RV, no significant valve disease.  Losartan  was held in the setting of starting Toprol  XL. Troponin elevation felt to be demand ischemia.   History of Present Illness Discussed the use of AI scribe software for clinical note transcription with the patient, who gave verbal consent to proceed.  History of Present Illness Ricky Bentley is a very pleasant 58 year old male who presents for follow-up of SVT. He experiences palpitations and a sensation of rapid heart rate, described as a 'funny feeling' in his heart. Initial episode that occurred prior to ED visit 8/21 occurred while he was sitting on the couch.  He noticed on smart watch that heart rate was 205 bpm prompting ED visit.  He has noted additional  spontaneous episodes since that time but they have lasted a shorter time.  One episode occurred a few days ago and resolved with valsalva maneuver, lasting less than twenty seconds.  Home BP is generally 130s over 80s.  He has been switched from losartan  to metoprolol  to manage symptoms of SVT. He notes high blood pressure in clinical settings, attributing it to anxiety. He plans to monitor blood pressure more consistently at home. He denies significant caffeine intake, consuming one cup of coffee in the morning and no sodas, preferring water  or sparkling water . He is trying to eat healthier and reduce sugar intake since the hospital visit and has reduced alcohol consumption, particularly during the week. Family history is significant for diabetes on his mother's side. He is unsure if his A1c has been tested during regular physicals.  He denies chest pain, dyspnea, orthopnea, PND, edema, presyncope, syncope.    ROS: See HPI  Studies Reviewed       No results found for: LIPOA  Risk Assessment/Calculations           Physical Exam VS:  BP 134/86   Pulse (!) 54   Ht 5' 10 (1.778 m)   Wt 259 lb 9.6 oz (117.8 kg)   SpO2 98%   BMI 37.25 kg/m    Wt Readings from Last 3 Encounters:  03/03/24 259 lb 9.6 oz (117.8 kg)  02/05/24 264 lb 12.4 oz (120.1 kg)  07/28/23 261 lb (118.4 kg)    GEN: Well nourished, well developed in no acute distress NECK: No JVD; No carotid bruits CARDIAC: RRR, no murmurs, rubs, gallops RESPIRATORY:  Clear to auscultation without rales, wheezing or rhonchi  ABDOMEN: Soft, non-tender, non-distended EXTREMITIES:  No edema; No deformity   Assessment & Plan Paroxysmal supraventricular tachycardia (SVT)   Intermittent SVT episodes since ED visit on 02/15/24 where home HR was recorded at 205 bpm.  He has had brief episodes which terminate with Valsalva maneuvers. Echocardiogram 02/06/24 revealed normal heart function and no significant valvular disease.  We discussed  potential ablation in the future if episodes continue.  - Continue Toprol  XL 25 mg daily for rate control, refill provided today -Consider an electrophysiology referral if episodes continue - We reviewed potential triggers including caffeine and alcohol consumption, dehydration, and poor sleep  - Reviewed Valsalva maneuvers and non-pharmacological interventions  Essential hypertension   BP initially elevated but improved on my recheck.  Home blood pressure readings are generally slightly higher than target of < 130/80. He reports white coat hypertension.  He was previously on losartan  and I explained that metoprolol  is not as potent antihypertensive.   - Recommend home monitoring for goal  < 130/80 and send readings in 2 week -Consider resuming losartan  if BP remains above goal  -Continue lifestyle modifications, including heart healthy, low-sodium diet and regular physical activity  Cardiac risk Lipid screening Lipid panel completed 09/08/2023 revealed total cholesterol 135, HDL 38, LDL 85, triglycerides 60.  He is not on lipid-lowering therapy.  We reviewed cardiac risk reduction including continued good BP, glucose, and lipid control.  He is already making lifestyle modifications to increase physical activity and improve diet. -Focus on heart healthy mostly whole food diet avoiding saturated fat, processed foods, simple carbohydrates, and sugar  -Increase general physical activity including taking frequent walk breaks, taking the stairs and parking far from the door - Aim for at least 150 minutes of moderate intensity exercise each week        Dispo: TBD based on home BP readings  Signed, [name], NP-C

## 2024-03-03 NOTE — Patient Instructions (Addendum)
 Medication Instructions:   Your physician recommends that you continue on your current medications as directed. Please refer to the Current Medication list given to you today.   *If you need a refill on your cardiac medications before your next appointment, please call your pharmacy*  Lab Work:  None ordered.   If you have labs (blood work) drawn today and your tests are completely normal, you will receive your results only by: MyChart Message (if you have MyChart) OR A paper copy in the mail If you have any lab test that is abnormal or we need to change your treatment, we will call you to review the results.  Testing/Procedures:  None ordered.  Follow-Up: At [facility], you and your health needs are our priority.  As part of our continuing mission to provide you with exceptional heart care, our providers are all part of one team.  This team includes your primary Cardiologist (physician) and Advanced Practice Providers or APPs (Physician Assistants and Nurse Practitioners) who all work together to provide you with the care you need, when you need it.  Your next appointment:   Based on your BP results.   Provider:   [name], NP    We recommend signing up for the patient portal called MyChart.  Sign up information is provided on this After Visit Summary.  MyChart is used to connect with patients for Virtual Visits (Telemedicine).  Patients are able to view lab/test results, encounter notes, upcoming appointments, etc.  Non-urgent messages can be sent to your provider as well.   To learn more about what you can do with MyChart, go to [email or web address].   Other Instructions  HOW TO TAKE YOUR BLOOD PRESSURE  Rest 5 minutes before taking your blood pressure. Don't  smoke or drink caffeinated beverages for at least 30 minutes before. Take your blood pressure before (not after) you eat. Sit comfortably with your back supported and both feet on the floor (  don't cross your legs). Elevate your arm to heart level on a table or a desk. Use the proper sized cuff.  It should fit smoothly and snugly around your bare upper arm.  There should be  Enough room to slip a fingertip under the cuff.  The bottom edge of the cuff should be 1 inch above the crease Of the elbow. Please monitor your blood pressure once daily 2 hours after your am medication. Your goal for your blood pressure Is  130 (systolic) top number or over 80 ( diastolic) bottom number   Please call our office at [phone] or send Mychart message in 2 weeks and this will be based on your follow up.    ----Avoid cold medicines with D or DM at the end of them----  Adopting a Healthy Lifestyle.   Weight: Know what a healthy weight is for you (roughly BMI <25) and aim to maintain this. You can calculate your body mass index on your smart phone. Unfortunately, this is not the most accurate measure of healthy weight, but it is the simplest measurement to use. A more accurate measurement involves body scanning which measures lean muscle, fat tissue and bony density. We do not have this equipment at [facility].    Diet: Aim for 7+ servings of fruits and vegetables daily Limit animal fats in diet for cholesterol and heart health - choose grass fed whenever available Avoid highly processed foods (fast food burgers, tacos, fried chicken, pizza, hot dogs, french fries)  Saturated fat comes in  the form of butter, lard, coconut oil, margarine, partially hydrogenated oils, and fat in meat. These increase your risk of cardiovascular disease.  Use healthy plant oils, such as olive, canola, soy, corn, sunflower and peanut.  Whole foods such as fruits, vegetables and whole grains have fiber  Men need > 38 grams of fiber per day Women need > 25 grams of fiber per day  Load up on vegetables and fruits - one-half of your plate: Aim for color and variety, and remember that potatoes dont count. Go for whole  grains - one-quarter of your plate: Whole wheat, barley, wheat berries, quinoa, oats, brown rice, and foods made with them. If you want pasta, go with whole wheat pasta. Protein power - one-quarter of your plate: Fish, chicken, beans, and nuts are all healthy, versatile protein sources. Limit red meat. You need carbohydrates for energy! The type of carbohydrate is more important than the amount. Choose carbohydrates such as vegetables, fruits, whole grains, beans, and nuts in the place of white rice, white pasta, potatoes (baked or fried), macaroni and cheese, cakes, cookies, and donuts.  If youre thirsty, drink water . Coffee and tea are good in moderation, but skip sugary drinks and limit milk and dairy products to one or two daily servings. Keep sugar intake at 6 teaspoons or 24 grams or LESS       Exercise: Aim for 150 min of moderate intensity exercise weekly for heart health, and weights twice weekly for bone health Stay active - any steps are better than no steps! Aim for 7-9 hours of sleep daily
